# Patient Record
Sex: Female | Born: 1970 | Race: White | Hispanic: No | Marital: Married | Smoking: Former smoker
Health system: Southern US, Community
[De-identification: ages and names within clinical notes are randomized; demographics above are authoritative.]

## PROBLEM LIST (undated history)

## (undated) DIAGNOSIS — G35 Multiple sclerosis: Secondary | ICD-10-CM

## (undated) DIAGNOSIS — F419 Anxiety disorder, unspecified: Secondary | ICD-10-CM

## (undated) DIAGNOSIS — G709 Myoneural disorder, unspecified: Secondary | ICD-10-CM

## (undated) DIAGNOSIS — F4321 Adjustment disorder with depressed mood: Secondary | ICD-10-CM

## (undated) DIAGNOSIS — F988 Other specified behavioral and emotional disorders with onset usually occurring in childhood and adolescence: Secondary | ICD-10-CM

## (undated) DIAGNOSIS — G43909 Migraine, unspecified, not intractable, without status migrainosus: Secondary | ICD-10-CM

## (undated) HISTORY — PX: TONSILLECTOMY AND ADENOIDECTOMY: SHX28

## (undated) HISTORY — DX: Adjustment disorder with depressed mood: F43.21

## (undated) HISTORY — PX: WISDOM TOOTH EXTRACTION: SHX21

## (undated) HISTORY — DX: Other specified behavioral and emotional disorders with onset usually occurring in childhood and adolescence: F98.8

## (undated) HISTORY — DX: Myoneural disorder, unspecified: G70.9

## (undated) HISTORY — DX: Anxiety disorder, unspecified: F41.9

---

## 1996-11-16 HISTORY — PX: PILONIDAL CYST EXCISION: SHX744

## 2006-11-16 HISTORY — PX: DILATION AND CURETTAGE OF UTERUS: SHX78

## 2017-05-21 ENCOUNTER — Emergency Department (HOSPITAL_BASED_OUTPATIENT_CLINIC_OR_DEPARTMENT_OTHER)
Admission: EM | Admit: 2017-05-21 | Discharge: 2017-05-21 | Disposition: A | Discharge status: Home / Self Care | Payer: 59 | Attending: Emergency Medicine | Admitting: Emergency Medicine

## 2017-05-21 ENCOUNTER — Encounter (HOSPITAL_BASED_OUTPATIENT_CLINIC_OR_DEPARTMENT_OTHER): Payer: Self-pay | Admitting: *Deleted

## 2017-05-21 DIAGNOSIS — R1031 Right lower quadrant pain: Secondary | ICD-10-CM

## 2017-05-21 DIAGNOSIS — Z87891 Personal history of nicotine dependence: Secondary | ICD-10-CM | POA: Diagnosis not present

## 2017-05-21 DIAGNOSIS — N201 Calculus of ureter: Secondary | ICD-10-CM | POA: Diagnosis present

## 2017-05-21 DIAGNOSIS — K567 Ileus, unspecified: Secondary | ICD-10-CM

## 2017-05-21 DIAGNOSIS — N200 Calculus of kidney: Secondary | ICD-10-CM | POA: Insufficient documentation

## 2017-05-21 HISTORY — DX: Multiple sclerosis: G35

## 2017-05-21 HISTORY — DX: Migraine, unspecified, not intractable, without status migrainosus: G43.909

## 2017-05-21 LAB — COMPREHENSIVE METABOLIC PANEL
ALT: 13 U/L — AB (ref 14–54)
ANION GAP: 9 (ref 5–15)
AST: 17 U/L (ref 15–41)
Albumin: 3.9 g/dL (ref 3.5–5.0)
Alkaline Phosphatase: 62 U/L (ref 38–126)
BUN: 17 mg/dL (ref 6–20)
CHLORIDE: 103 mmol/L (ref 101–111)
CO2: 23 mmol/L (ref 22–32)
Calcium: 8.8 mg/dL — ABNORMAL LOW (ref 8.9–10.3)
Creatinine, Ser: 1 mg/dL (ref 0.44–1.00)
GFR calc non Af Amer: >60 mL/min (ref >60)
Glucose, Bld: 107 mg/dL — ABNORMAL HIGH (ref 65–99)
Potassium: 4.2 mmol/L (ref 3.5–5.1)
SODIUM: 135 mmol/L (ref 135–145)
Total Bilirubin: 0.4 mg/dL (ref 0.3–1.2)
Total Protein: 7.3 g/dL (ref 6.5–8.1)

## 2017-05-21 LAB — CBC WITH DIFFERENTIAL/PLATELET
Basophils Absolute: 0 10*3/uL (ref 0.0–0.1)
Basophils Relative: 0 %
Eosinophils Absolute: 0.1 10*3/uL (ref 0.0–0.7)
Eosinophils Relative: 1 %
HEMATOCRIT: 35.2 % — AB (ref 36.0–46.0)
HEMOGLOBIN: 11.9 g/dL — AB (ref 12.0–15.0)
LYMPHS ABS: 1.7 10*3/uL (ref 0.7–4.0)
Lymphocytes Relative: 10 %
MCH: 30.4 pg (ref 26.0–34.0)
MCHC: 33.8 g/dL (ref 30.0–36.0)
MCV: 89.8 fL (ref 78.0–100.0)
MONOS PCT: 7 %
Monocytes Absolute: 1.2 10*3/uL — ABNORMAL HIGH (ref 0.1–1.0)
NEUTROS ABS: 13.4 10*3/uL — AB (ref 1.7–7.7)
NEUTROS PCT: 82 %
Platelets: 252 10*3/uL (ref 150–400)
RBC: 3.92 MIL/uL (ref 3.87–5.11)
RDW: 12.7 % (ref 11.5–15.5)
WBC: 16.4 10*3/uL — ABNORMAL HIGH (ref 4.0–10.5)

## 2017-05-21 LAB — URINALYSIS, ROUTINE W REFLEX MICROSCOPIC
BILIRUBIN URINE: NEGATIVE
Glucose, UA: NEGATIVE mg/dL
HGB URINE DIPSTICK: NEGATIVE
Ketones, ur: 15 mg/dL — AB
Leukocytes, UA: NEGATIVE
NITRITE: NEGATIVE
PH: 6 (ref 5.0–8.0)
Protein, ur: NEGATIVE mg/dL

## 2017-05-21 MED ORDER — ONDANSETRON 4 MG PO TBDP: 4.0000 mg | ORAL_TABLET | Freq: Three times a day (TID) | ORAL | 0 refills | Status: DC | PRN

## 2017-05-21 MED ORDER — ONDANSETRON HCL 4 MG/2ML IJ SOLN
4.0000 mg | Freq: Once | INTRAMUSCULAR | Status: AC
  Administered 2017-05-21: 4 mg via INTRAVENOUS
  Filled 2017-05-21: qty 2

## 2017-05-21 MED ORDER — TAMSULOSIN HCL 0.4 MG PO CAPS: 0.4000 mg | ORAL_CAPSULE | Freq: Every day | ORAL | 0 refills | Status: DC

## 2017-05-21 MED ORDER — TAMSULOSIN HCL 0.4 MG PO CAPS
0.4000 mg | ORAL_CAPSULE | Freq: Every day | ORAL | Status: DC
  Administered 2017-05-21: 0.4 mg via ORAL
  Filled 2017-05-21: qty 1

## 2017-05-21 MED ORDER — TAMSULOSIN HCL 0.4 MG PO CAPS: 0.4000 mg | ORAL_CAPSULE | Freq: Every day | ORAL | 0 refills | Status: AC

## 2017-05-21 MED ORDER — IBUPROFEN 800 MG PO TABS: 800.0000 mg | ORAL_TABLET | Freq: Three times a day (TID) | ORAL | 0 refills | Status: DC | PRN

## 2017-05-21 MED ORDER — MORPHINE SULFATE (PF) 4 MG/ML IV SOLN
4.0000 mg | Freq: Once | INTRAVENOUS | Status: AC
  Administered 2017-05-21: 4 mg via INTRAVENOUS
  Filled 2017-05-21: qty 1

## 2017-05-21 MED ORDER — OXYCODONE-ACETAMINOPHEN 5-325 MG PO TABS
1.0000 | ORAL_TABLET | Freq: Once | ORAL | Status: AC
  Administered 2017-05-21: 1 via ORAL
  Filled 2017-05-21: qty 1

## 2017-05-21 MED ORDER — KETOROLAC TROMETHAMINE 30 MG/ML IJ SOLN
30.0000 mg | Freq: Once | INTRAMUSCULAR | Status: AC
  Administered 2017-05-21: 30 mg via INTRAVENOUS
  Filled 2017-05-21: qty 1

## 2017-05-21 MED ORDER — OXYCODONE-ACETAMINOPHEN 5-325 MG PO TABS: 1.0000 | ORAL_TABLET | Freq: Four times a day (QID) | ORAL | 0 refills | Status: DC | PRN

## 2017-05-21 NOTE — ED Triage Notes (Signed)
Reports being dx at [facility] today with L-sided kidney stone. States dx of obstruction and was instructed to come to ER.

## 2017-05-21 NOTE — Discharge Instructions (Signed)
You have been seen in the Emergency Department (ED) today for pain that we believe based on your workup, is caused by kidney stones.  As we have discussed, please drink plenty of fluids.  Please make a follow up appointment with the physician(s) listed elsewhere in this documentation. ° °You may take pain medication as needed but ONLY as prescribed.  Please also take your prescribed Flomax daily.  We also recommend that you take over-the-counter ibuprofen regularly according to label instructions over the next 5 days.  Take it with meals to minimize stomach discomfort. ° °Please see your doctor as soon as possible as stones may take 1-3 weeks to pass and you may require additional care or medications. ° °Do not drink alcohol, drive or participate in any other potentially dangerous activities while taking opiate pain medication as it may make you sleepy. Do not take this medication with any other sedating medications, either prescription or over-the-counter. If you were prescribed Percocet or Vicodin, do not take these with acetaminophen (Tylenol) as it is already contained within these medications. °  °Take Percocet as needed for severe pain.  This medication is an opiate (or narcotic) pain medication and can be habit forming.  Use it as little as possible to achieve adequate pain control.  Do not use or use it with extreme caution if you have a history of opiate abuse or dependence.  If you are on a pain contract with your primary care doctor or a pain specialist, be sure to let them know you were prescribed this medication today from the Emergency Department.  This medication is intended for your use only - do not give any to anyone else and keep it in a secure place where nobody else, especially children, have access to it.  It will also cause or worsen constipation, so you may want to consider taking an over-the-counter stool softener while you are taking this medication. ° °Return to the Emergency Department  (ED) or call your doctor if you have any worsening pain, fever, painful urination, are unable to urinate, or develop other symptoms that concern you. ° ° ° °Kidney Stones °Kidney stones (urolithiasis) are deposits that form inside your kidneys. The intense pain is caused by the stone moving through the urinary tract. When the stone moves, the ureter goes into spasm around the stone. The stone is usually passed in the urine.  °CAUSES  °A disorder that makes certain neck glands produce too much parathyroid hormone (primary hyperparathyroidism). °A buildup of uric acid crystals, similar to gout in your joints. °Narrowing (stricture) of the ureter. °A kidney obstruction present at birth (congenital obstruction). °Previous surgery on the kidney or ureters. °Numerous kidney infections. °SYMPTOMS  °Feeling sick to your stomach (nauseous). °Throwing up (vomiting). °Blood in the urine (hematuria). °Pain that usually spreads (radiates) to the groin. °Frequency or urgency of urination. °DIAGNOSIS  °Taking a history and physical exam. °Blood or urine tests. °CT scan. °Occasionally, an examination of the inside of the urinary bladder (cystoscopy) is performed. °TREATMENT  °Observation. °Increasing your fluid intake. °Extracorporeal shock wave lithotripsy--This is a noninvasive procedure that uses shock waves to break up kidney stones. °Surgery may be needed if you have severe pain or persistent obstruction. There are various surgical procedures. Most of the procedures are performed with the use of small instruments. Only small incisions are needed to accommodate these instruments, so recovery time is minimized. °The size, location, and chemical composition are all important variables that will determine the proper   choice of action for you. Talk to your health care provider to better understand your situation so that you will minimize the risk of injury to yourself and your kidney.  °HOME CARE INSTRUCTIONS  °Drink enough water  and fluids to keep your urine clear or pale yellow. This will help you to pass the stone or stone fragments. °Strain all urine through the provided strainer. Keep all particulate matter and stones for your health care provider to see. The stone causing the pain may be as small as a grain of salt. It is very important to use the strainer each and every time you pass your urine. The collection of your stone will allow your health care provider to analyze it and verify that a stone has actually passed. The stone analysis will often identify what you can do to reduce the incidence of recurrences. °Only take over-the-counter or prescription medicines for pain, discomfort, or fever as directed by your health care provider. °Keep all follow-up visits as told by your health care provider. This is important. °Get follow-up X-rays if required. The absence of pain does not always mean that the stone has passed. It may have only stopped moving. If the urine remains completely obstructed, it can cause loss of kidney function or even complete destruction of the kidney. It is your responsibility to make sure X-rays and follow-ups are completed. Ultrasounds of the kidney can show blockages and the status of the kidney. Ultrasounds are not associated with any radiation and can be performed easily in a matter of minutes. °Make changes to your daily diet as told by your health care provider. You may be told to: °Limit the amount of salt that you eat. °Eat 5 or more servings of fruits and vegetables each day. °Limit the amount of meat, poultry, fish, and eggs that you eat. °Collect a 24-hour urine sample as told by your health care provider. You may need to collect another urine sample every 6-12 months. °SEEK MEDICAL CARE IF: °You experience pain that is progressive and unresponsive to any pain medicine you have been prescribed. °SEEK IMMEDIATE MEDICAL CARE IF:  °Pain cannot be controlled with the prescribed medicine. °You have a  fever or shaking chills. °The severity or intensity of pain increases over 18 hours and is not relieved by pain medicine. °You develop a new onset of abdominal pain. °You feel faint or pass out. °You are unable to urinate. °  °This information is not intended to replace advice given to you by your health care provider. Make sure you discuss any questions you have with your health care provider. °  °Document Released: 11/02/2005 Document Revised: 07/24/2015 Document Reviewed: 04/05/2013 °Elsevier Interactive Patient Education ©2016 [employer] ° ° °

## 2017-05-21 NOTE — ED Provider Notes (Signed)
Emergency Department Provider Note   I have reviewed the triage vital signs and the nursing notes.   HISTORY  Chief Complaint Nephrolithiasis   HPI Tonya Curry is a 46 y.o. female with PMH of MS and Migraine HA presents to the emergency department for evaluation of obstructing right ureteral stone found on CT scan today. The patient reports right flank and abdominal pain has been constant over the past several days. She denies any hematuria, dysuria, fever. She has no prior history of kidney stone. She has had severe pain despite taking Motrin at home. She's been finding it very difficult to sleep at night because of pain. She's also had some associated constipation. She went to an urgent care today for evaluation and was sent for CT scan at an outpatient imaging center. She was called and told that she has an obstructing kidney stone on the right side and she should present to the emergency department immediately.   Since leaving the imaging department she continues to have severe right-sided pain and associated nausea. No vomiting. Pain is radiating slightly to her right inguinal crease. No vaginal bleeding or discharge.   Past Medical History:  Diagnosis Date  . Migraines   . Multiple sclerosis ([place])     There are no active problems to display for this patient.   Past Surgical History:  Procedure Laterality Date  . DILATION AND CURETTAGE OF UTERUS  2008    Current Outpatient Rx  . Order #: [id number] Class: Historical Med  . Order #: [id number] Class: Historical Med  . Order #: [id number] Class: Historical Med  . Order #: [id number] Class: Print  . Order #: [id number] Class: Print  . Order #: [id number] Class: Print  . Order #: [id number] Class: Print    Allergies Patient has no allergy information on record.  No family history on file.  Social History Social History  Substance Use Topics  . Smoking status: Former Research scientist (life sciences)  . Smokeless tobacco: Never Used  . Alcohol use  Yes     Comment: occ    Review of Systems  Constitutional: No fever/chills Eyes: No visual changes. ENT: No sore throat. Cardiovascular: Denies chest pain. Respiratory: Denies shortness of breath. Gastrointestinal: Positive right sided abdominal pain. Positive nausea, no vomiting.  No diarrhea.  No constipation. Genitourinary: Negative for dysuria. Positive right flank pain.  Musculoskeletal: Negative for back pain. Skin: Negative for rash. Neurological: Negative for headaches, focal weakness or numbness.  10-point ROS otherwise negative.  ____________________________________________   PHYSICAL EXAM:  VITAL SIGNS: ED Triage Vitals  Enc Vitals Group     BP 05/21/17 1651 111/88     Pulse Rate 05/21/17 1651 69     Resp 05/21/17 1651 16     Temp 05/21/17 1651 99.7 F (37.6 C)     Temp Source 05/21/17 1651 Oral     SpO2 05/21/17 1651 99 %     Weight 05/21/17 1652 135 lb (61.2 kg)     Height 05/21/17 1652 5\' 3"  (1.6 m)     Pain Score 05/21/17 1650 10   Constitutional: Alert and oriented. Appears uncomfortable.  Eyes: Conjunctivae are normal. Head: Atraumatic. Nose: No congestion/rhinnorhea. Mouth/Throat: Mucous membranes are moist.  Oropharynx non-erythematous. Neck: No stridor.  Cardiovascular: Normal rate, regular rhythm. Good peripheral circulation. Grossly normal heart sounds.   Respiratory: Normal respiratory effort.  No retractions. Lungs CTAB. Gastrointestinal: Soft with focal RLQ tenderness to palpation. No rebound but some voluntary guarding noted. Mild distention.  Musculoskeletal: No lower extremity tenderness nor edema. No  gross deformities of extremities. Neurologic:  Normal speech and language. No gross focal neurologic deficits are appreciated.  Skin:  Skin is warm, dry and intact. No rash noted.  ____________________________________________   LABS (all labs ordered are listed, but only abnormal results are displayed)  Labs Reviewed  COMPREHENSIVE  METABOLIC PANEL - Abnormal; Notable for the following:       Result Value   Glucose, Bld 107 (*)    Calcium 8.8 (*)    ALT 13 (*)    All other components within normal limits  CBC WITH DIFFERENTIAL/PLATELET - Abnormal; Notable for the following:    WBC 16.4 (*)    Hemoglobin 11.9 (*)    HCT 35.2 (*)    Neutro Abs 13.4 (*)    Monocytes Absolute 1.2 (*)    All other components within normal limits  URINALYSIS, ROUTINE W REFLEX MICROSCOPIC - Abnormal; Notable for the following:    Specific Gravity, Urine >1.046 (*)    Ketones, ur 15 (*)    All other components within normal limits  URINE CULTURE   ____________________________________________  RADIOLOGY  CT abdomen/pelvis with contrast from [facility] [place] facility 05/21/17:  IMPRESSION: 1.10 x 5 mm calculus at the right ureterovesical junction producing mild to moderate hydronephrosis. 2.Numerous small suspected cysts scattered throughout the liver. 3.Air-fluid levels throughout the colon most suggestive of mild ileus/gastroenteritis.    Electronically Signed by: Lilia Pro ____________________________________________   PROCEDURES  Procedure(s) performed:   Procedures  None ____________________________________________   INITIAL IMPRESSION / ASSESSMENT AND PLAN / ED COURSE  Pertinent labs & imaging results that were available during my care of the patient were reviewed by me and considered in my medical decision making (see chart for details).  Patient resents to the emergency department for evaluation of severe right sided flank pain with CT scan performed at outside imaging center showing a 10x5 mm right ureteral stone and the UVJ with associated mild/moderate hydronephrosis. Reviewed on Care Everywhere through [place] facility. Patient also with air-fluid levels throughout the colon suggestive of ileus versus gastroenteritis. No concern on outside read of SBO. Patient has focal right sided abdominal tenderness to palpation.  I am administering Toradol and morphine along with Zofran for pain control. Outside labs were point of care and did not include a chemistry. Plan to reassess after labs, UA, urine culture, and pain medication.   07:23 PM Patient is much more comfortable after Toradol and morphine. She is ambulatory to the bathroom to give a urine sample. Labs and UA are largely unremarkable with the exception of leukocytosis which I expect in this clinical setting. Given the size of stone I called the Urologist on call Dr. Ruby Cola who advised the patient call the office on Monday for urgent appointment.   At this time, I do not feel there is any life-threatening condition present. I have reviewed and discussed all results (EKG, imaging, lab, urine as appropriate), exam findings with patient. I have reviewed nursing notes and appropriate previous records.  I feel the patient is safe to be discharged home without further emergent workup. Discussed usual and customary return precautions. Patient and family (if present) verbalize understanding and are comfortable with this plan.  Patient will follow-up with their primary care provider. If they do not have a primary care provider, information for follow-up has been provided to them. All questions have been answered.  ____________________________________________  FINAL CLINICAL IMPRESSION(S) / ED DIAGNOSES  Final diagnoses:  Kidney stone  Ileus ([place])  Right lower quadrant abdominal  pain     MEDICATIONS GIVEN DURING THIS VISIT:  Medications  ketorolac (TORADOL) 30 MG/ML injection 30 mg (30 mg Intravenous Given 05/21/17 1745)  morphine 4 MG/ML injection 4 mg (4 mg Intravenous Given 05/21/17 1746)  ondansetron (ZOFRAN) injection 4 mg (4 mg Intravenous Given 05/21/17 1745)  oxyCODONE-acetaminophen (PERCOCET/ROXICET) 5-325 MG per tablet 1 tablet (1 tablet Oral Given 05/21/17 1944)     NEW OUTPATIENT MEDICATIONS STARTED DURING THIS VISIT:  None   Note:  This document  was prepared using Dragon voice recognition software and may include unintentional dictation errors.  Nanda Quinton, MD Emergency Medicine    Adri Schloss, [facility], MD 05/22/17 [phone]

## 2017-05-23 LAB — URINE CULTURE: Special Requests: NORMAL

## 2017-12-14 ENCOUNTER — Telehealth: Payer: Self-pay | Admitting: *Deleted

## 2017-12-14 NOTE — Telephone Encounter (Signed)
LMVM for pt to return call to r/s appt due to weather and delay in opening.

## 2017-12-14 NOTE — Telephone Encounter (Signed)
I LMVM for pt that I made mistake and she is to keep appt 12-16-17.

## 2017-12-16 ENCOUNTER — Ambulatory Visit (INDEPENDENT_AMBULATORY_CARE_PROVIDER_SITE_OTHER): Payer: 59 | Admitting: Neurology

## 2017-12-16 ENCOUNTER — Ambulatory Visit: Payer: 59 | Admitting: Neurology

## 2017-12-16 ENCOUNTER — Encounter: Payer: Self-pay | Admitting: Neurology

## 2017-12-16 VITALS — BP 117/80 | HR 84 | Ht 62.5 in | Wt 136.0 lb

## 2017-12-16 DIAGNOSIS — G43709 Chronic migraine without aura, not intractable, without status migrainosus: Secondary | ICD-10-CM | POA: Diagnosis not present

## 2017-12-16 DIAGNOSIS — G35 Multiple sclerosis: Secondary | ICD-10-CM | POA: Diagnosis not present

## 2017-12-16 DIAGNOSIS — IMO0002 Reserved for concepts with insufficient information to code with codable children: Secondary | ICD-10-CM | POA: Insufficient documentation

## 2017-12-16 NOTE — Progress Notes (Signed)
PATIENT: Tonya Curry DOB: 04/08/1971  Chief Complaint  Patient presents with  . New Patient (Initial Visit)    PCP: Dr. Jimmie Molly, Ref: Dr. Domenick Gong  . Multiple Sclerosis    Dx in 12/2000. Patient was on Avonex in the past but it made her too sick. Last MRI about 10 yrs ago.      HISTORICAL side Tonya Curry is a 47 year old female, seen in refer by her primary care doctor Tonya Curry, for evaluation of multiple sclerosis, initial evaluation was on December 16, 2017.  I reviewed and summarized the referring note, she recently moved from [place] to [place], has history of Relapsing Remitting Multiple Sclerosis, chronic migraine headaches, last visit with her neurologist was in 2005.  She presented with acute onset of radiating pain to bilateral upper and lower extremity in February 2002, 4 months postpartum, symptoms quickly progressed to difficulty walking, with profound right arm, and right leg weakness, the diagnosis of relapsing remitting multiple sclerosis was based on abnormal MRI of cervical spine, there was no MRI brain lesions per patient's recall.  She was treated with steroid for 1 week, with significant side effect, fortunately, her motor deficit improved quickly.  She also had spinal tap, evoked potential study, extensive laboratory evaluations, which all support a diagnosis of multiple sclerosis,  She was treated with Avonex from 2002 to October 2005, there was no recurrent symptoms, she stopped the treatment, had a second pregnancy, did very well,  Over the years, she is highly function, work as a Environmental manager, continue have right leg burning sensation from knee down, taking Cymbalta every morning, but denies gait abnormality, no recurrent neurological symptoms, has bladder urgency, denies significant gait abnormality..  She also has history of chronic migraine headache for many years, taking Topamax 50 mg every morning as migraine prevention,  Zomig works very well.  Trigger for her migraine are weather change, stress, hungry, dehydration.  Over the years, complains of gradual worsening fatigue, also complains of difficulty focusing, was started on Adderall treatment since December 2018.   REVIEW OF SYSTEMS: Full 14 system review of systems performed and notable only for as above.  ALLERGIES: Not on File  HOME MEDICATIONS: Current Outpatient Medications  Medication Sig Dispense Refill  . amphetamine-dextroamphetamine (ADDERALL) 20 MG tablet Take 20 mg by mouth daily.    . DULoxetine (CYMBALTA) 60 MG capsule Take 60 mg by mouth daily.    Marland Kitchen topiramate (TOPAMAX) 50 MG tablet Take 50 mg by mouth 2 (two) times daily.    Marland Kitchen zolmitriptan (ZOMIG) 5 MG tablet Take 5 mg by mouth as needed for migraine.     No current facility-administered medications for this visit.     PAST MEDICAL HISTORY: Past Medical History:  Diagnosis Date  . Migraines   . Multiple sclerosis ([place])     PAST SURGICAL HISTORY: Past Surgical History:  Procedure Laterality Date  . DILATION AND CURETTAGE OF UTERUS  2008    FAMILY HISTORY: No family history on file.  SOCIAL HISTORY:  Social History   Socioeconomic History  . Marital status: Married    Spouse name: Not on file  . Number of children: Not on file  . Years of education: Not on file  . Highest education level: Not on file  Social Needs  . Financial resource strain: Not on file  . Food insecurity - worry: Not on file  . Food insecurity - inability: Not on file  . Transportation needs - medical:  Not on file  . Transportation needs - non-medical: Not on file  Occupational History  . Not on file  Tobacco Use  . Smoking status: Former Research scientist (life sciences)  . Smokeless tobacco: Never Used  Substance and Sexual Activity  . Alcohol use: Yes    Comment: occ  . Drug use: No  . Sexual activity: Not on file  Other Topics Concern  . Not on file  Social History Narrative  . Not on file     PHYSICAL  EXAM   Vitals:   12/16/17 0752  BP: 117/80  Pulse: 84  Weight: 136 lb (61.7 kg)  Height: 5' 2.5" (1.588 m)    Not recorded      Body mass index is 24.48 kg/m.  PHYSICAL EXAMNIATION:  Gen: NAD, conversant, well nourised, obese, well groomed                     Cardiovascular: Regular rate rhythm, no peripheral edema, warm, nontender. Eyes: Conjunctivae clear without exudates or hemorrhage Neck: Supple, no carotid bruits. Pulmonary: Clear to auscultation bilaterally   NEUROLOGICAL EXAM:  MENTAL STATUS: Speech:    Speech is normal; fluent and spontaneous with normal comprehension.  Cognition:     Orientation to time, place and person     Normal recent and remote memory     Normal Attention span and concentration     Normal Language, naming, repeating,spontaneous speech     Fund of knowledge   CRANIAL NERVES: CN II: Visual fields are full to confrontation. Fundoscopic exam is normal with sharp discs and no vascular changes. Pupils are round equal and briskly reactive to light. CN III, IV, VI: extraocular movement are normal. No ptosis. CN V: Facial sensation is intact to pinprick in all 3 divisions bilaterally. Corneal responses are intact.  CN VII: Face is symmetric with normal eye closure and smile. CN VIII: Hearing is normal to rubbing fingers CN IX, X: Palate elevates symmetrically. Phonation is normal. CN XI: Head turning and shoulder shrug are intact CN XII: Tongue is midline with normal movements and no atrophy.  MOTOR: There is no pronator drift of out-stretched arms. Muscle bulk and tone are normal. Muscle strength is normal.  REFLEXES: Reflexes are 3 and symmetric at the biceps, triceps, knees, and ankles. Plantar responses are flexor.  SENSORY: Intact to light touch, pinprick, positional sensation and vibratory sensation are intact in fingers and toes.  COORDINATION: Rapid alternating movements and fine finger movements are intact. There is no dysmetria  on finger-to-nose and heel-knee-shin.    GAIT/STANCE: Posture is normal. Gait is steady with normal steps, base, arm swing, and turning. Heel and toe walking are normal. Tandem gait is normal.  Romberg is absent.   DIAGNOSTIC DATA (LABS, IMAGING, TESTING) - I reviewed patient records, labs, notes, testing and imaging myself where available.   ASSESSMENT AND PLAN  Tonya Curry is a 47 y.o. female    Relapsing remitting multiple sclerosis  She was diagnosed in 2002  Was treated with Avonex from 2002 to  October 2005  MRI of brain, cervical spine with without contrast  Laboratory evaluations  Return to clinic in 1 month   Chronic migraine headaches  Keep Topamax 50 mg every day  Zomig as needed  Marcial Pacas, M.D. Ph.D.  [facility] [address], [place], [place] [address] Ph: [phone] Fax: [phone]  CC: Tonya Battles, MD

## 2017-12-18 LAB — COMPREHENSIVE METABOLIC PANEL
ALBUMIN: 4.7 g/dL (ref 3.5–5.5)
ALT: 13 IU/L (ref 0–32)
AST: 15 IU/L (ref 0–40)
Albumin/Globulin Ratio: 1.8 (ref 1.2–2.2)
Alkaline Phosphatase: 77 IU/L (ref 39–117)
BUN / CREAT RATIO: 16 (ref 9–23)
BUN: 11 mg/dL (ref 6–24)
Bilirubin Total: 0.3 mg/dL (ref 0.0–1.2)
CALCIUM: 9.8 mg/dL (ref 8.7–10.2)
CO2: 22 mmol/L (ref 20–29)
CREATININE: 0.68 mg/dL (ref 0.57–1.00)
Chloride: 104 mmol/L (ref 96–106)
GFR, EST AFRICAN AMERICAN: 122 mL/min/{1.73_m2} (ref >59)
GFR, EST NON AFRICAN AMERICAN: 106 mL/min/{1.73_m2} (ref >59)
GLUCOSE: 85 mg/dL (ref 65–99)
Globulin, Total: 2.6 g/dL (ref 1.5–4.5)
Potassium: 4.7 mmol/L (ref 3.5–5.2)
Sodium: 141 mmol/L (ref 134–144)
TOTAL PROTEIN: 7.3 g/dL (ref 6.0–8.5)

## 2017-12-18 LAB — CBC WITH DIFFERENTIAL/PLATELET
BASOS ABS: 0 10*3/uL (ref 0.0–0.2)
Basos: 0 %
EOS (ABSOLUTE): 0.1 10*3/uL (ref 0.0–0.4)
EOS: 1 %
HEMATOCRIT: 40.8 % (ref 34.0–46.6)
HEMOGLOBIN: 13.9 g/dL (ref 11.1–15.9)
IMMATURE GRANS (ABS): 0 10*3/uL (ref 0.0–0.1)
Immature Granulocytes: 0 %
LYMPHS: 32 %
Lymphocytes Absolute: 2.4 10*3/uL (ref 0.7–3.1)
MCH: 30.4 pg (ref 26.6–33.0)
MCHC: 34.1 g/dL (ref 31.5–35.7)
MCV: 89 fL (ref 79–97)
MONOCYTES: 5 %
Monocytes Absolute: 0.4 10*3/uL (ref 0.1–0.9)
Neutrophils Absolute: 4.5 10*3/uL (ref 1.4–7.0)
Neutrophils: 62 %
Platelets: 356 10*3/uL (ref 150–379)
RBC: 4.57 x10E6/uL (ref 3.77–5.28)
RDW: 13.4 % (ref 12.3–15.4)
WBC: 7.3 10*3/uL (ref 3.4–10.8)

## 2017-12-18 LAB — COPPER, SERUM: COPPER: 132 ug/dL (ref 72–166)

## 2017-12-18 LAB — FERRITIN: FERRITIN: 60 ng/mL (ref 15–150)

## 2017-12-18 LAB — VITAMIN B12: Vitamin B-12: 1205 pg/mL (ref 232–1245)

## 2017-12-18 LAB — VITAMIN D 25 HYDROXY (VIT D DEFICIENCY, FRACTURES): Vit D, 25-Hydroxy: 29.3 ng/mL — ABNORMAL LOW (ref 30.0–100.0)

## 2017-12-18 LAB — SEDIMENTATION RATE: SED RATE: 2 mm/h (ref 0–32)

## 2017-12-18 LAB — RPR: RPR Ser Ql: NONREACTIVE

## 2017-12-18 LAB — ANA W/REFLEX: ANA: NEGATIVE

## 2017-12-18 LAB — TSH: TSH: 2.15 u[IU]/mL (ref 0.450–4.500)

## 2017-12-18 LAB — CK: CK TOTAL: 81 U/L (ref 24–173)

## 2017-12-18 LAB — C-REACTIVE PROTEIN: CRP: 3.2 mg/L (ref 0.0–4.9)

## 2017-12-18 LAB — HGB A1C W/O EAG: Hgb A1c MFr Bld: 5.4 % (ref 4.8–5.6)

## 2017-12-22 ENCOUNTER — Telehealth: Payer: Self-pay | Admitting: *Deleted

## 2017-12-22 NOTE — Telephone Encounter (Signed)
Spoke with patient and informed her, per Dr Krista Blue her labs are normal. Patient verbalized understanding, appreciation.

## 2018-02-03 ENCOUNTER — Institutional Professional Consult (permissible substitution): Payer: 59 | Admitting: Neurology

## 2018-02-04 ENCOUNTER — Other Ambulatory Visit: Payer: 59

## 2018-02-07 ENCOUNTER — Ambulatory Visit: Payer: 59 | Admitting: Neurology

## 2018-02-08 ENCOUNTER — Encounter: Payer: Self-pay | Admitting: Neurology

## 2018-02-11 ENCOUNTER — Ambulatory Visit
Admission: RE | Admit: 2018-02-11 | Discharge: 2018-02-11 | Disposition: A | Discharge status: Home / Self Care | Payer: 59 | Source: Ambulatory Visit | Attending: Neurology | Admitting: Neurology

## 2018-02-11 DIAGNOSIS — IMO0002 Reserved for concepts with insufficient information to code with codable children: Secondary | ICD-10-CM

## 2018-02-11 DIAGNOSIS — G43709 Chronic migraine without aura, not intractable, without status migrainosus: Secondary | ICD-10-CM

## 2018-02-11 DIAGNOSIS — G35 Multiple sclerosis: Secondary | ICD-10-CM

## 2018-02-11 MED ORDER — GADOBENATE DIMEGLUMINE 529 MG/ML IV SOLN: 12.0000 mL | Freq: Once | INTRAVENOUS | Status: DC | PRN

## 2018-02-11 MED ORDER — GADOBENATE DIMEGLUMINE 529 MG/ML IV SOLN
12.0000 mL | Freq: Once | INTRAVENOUS | Status: AC | PRN
  Administered 2018-02-11: 12 mL via INTRAVENOUS

## 2018-02-14 ENCOUNTER — Telehealth: Payer: Self-pay | Admitting: *Deleted

## 2018-02-14 ENCOUNTER — Telehealth: Payer: Self-pay | Admitting: Neurology

## 2018-02-14 NOTE — Telephone Encounter (Signed)
-----   Message from Marcial Pacas, MD sent at 02/14/2018  3:21 PM EDT ----- Please call pt for normal MRI brain.

## 2018-02-14 NOTE — Telephone Encounter (Signed)
Please call patient, MRI of the brain is normal, cervical spine showed mild signal adjacent to C4, there is no acute process,  Will review MRI at her next follow-up visit   IMPRESSION: This MRI of the cervical spine with and without contrast shows the following: 1.    Adjacent to C4 within the spinal cord there is the suggestion of a small focus on the sagittal T2 and STIR images that is not confirmed on the axial images.  This could be artifactual or represent a small focus of demyelination or myelomalacia. 2.    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  There is no definite nerve root compression though there is some encroachment upon the left C6 nerve root. 3.    There is a normal enhancement pattern.  MRI brain is normal.

## 2018-02-15 NOTE — Telephone Encounter (Signed)
Left message requesting a return call.

## 2018-02-15 NOTE — Telephone Encounter (Signed)
Spoke to patient - she is aware of results and her follow up has been moved to an earlier date for further discussion with Dr. Krista Blue.

## 2018-02-24 ENCOUNTER — Ambulatory Visit (INDEPENDENT_AMBULATORY_CARE_PROVIDER_SITE_OTHER): Payer: 59 | Admitting: Neurology

## 2018-02-24 ENCOUNTER — Encounter (INDEPENDENT_AMBULATORY_CARE_PROVIDER_SITE_OTHER): Payer: Self-pay

## 2018-02-24 ENCOUNTER — Encounter: Payer: Self-pay | Admitting: Neurology

## 2018-02-24 VITALS — BP 104/71 | HR 75 | Ht 62.5 in | Wt 135.5 lb

## 2018-02-24 DIAGNOSIS — G35 Multiple sclerosis: Secondary | ICD-10-CM | POA: Diagnosis not present

## 2018-02-24 DIAGNOSIS — G43709 Chronic migraine without aura, not intractable, without status migrainosus: Secondary | ICD-10-CM | POA: Diagnosis not present

## 2018-02-24 DIAGNOSIS — IMO0002 Reserved for concepts with insufficient information to code with codable children: Secondary | ICD-10-CM

## 2018-02-24 MED ORDER — NORTRIPTYLINE HCL 10 MG PO CAPS: 30.0000 mg | ORAL_CAPSULE | Freq: Every day | ORAL | 11 refills | Status: DC

## 2018-02-24 NOTE — Progress Notes (Signed)
PATIENT: Tonya Curry DOB: September 25, 1971  Chief Complaint  Patient presents with  . Multiple Sclerosis    She would like to review her MRI results.  . Migraine    Topamax and Zomig work well for her migraines.     HISTORICAL side Tonya Curry is a 47 year old female, seen in refer by her primary care doctor Leanna Battles, for evaluation of multiple sclerosis, initial evaluation was on December 16, 2017.  I reviewed and summarized the referring note, she recently moved from [place] to [place], has history of Relapsing Remitting Multiple Sclerosis, chronic migraine headaches, last visit with her neurologist was in 2005.  She presented with acute onset of radiating pain to bilateral upper and lower extremity in February 2002, 4 months postpartum, symptoms quickly progressed to difficulty walking, with profound right arm, and right leg weakness, the diagnosis of relapsing remitting multiple sclerosis was based on abnormal MRI of cervical spine, there was no MRI brain lesions per patient's recall.  She was treated with steroid for 1 week, with significant side effect, fortunately, her motor deficit improved quickly.  She also had spinal tap, evoked potential study, extensive laboratory evaluations, which all support a diagnosis of multiple sclerosis,  She was treated with Avonex from 2002 to October 2005, there was no recurrent symptoms, she stopped the treatment, had a second pregnancy, did very well,  Over the years, she is highly function, work as a Environmental manager, continue have right leg burning sensation from knee down, taking Cymbalta every morning, but denies gait abnormality, no recurrent neurological symptoms, has bladder urgency, denies significant gait abnormality..  She also has history of chronic migraine headache for many years, taking Topamax 50 mg every morning as migraine prevention, Zomig works very well.  Trigger for her migraine are weather change, stress,  hungry, dehydration.  Over the years, complains of gradual worsening fatigue, also complains of difficulty focusing, was started on Adderall treatment since December 2018.  UPDATE February 24 2018: She is overall doing very well there is no new neurological symptoms, but she continue complains of slow worsening fatigue, intermittent right lower extremity paresthesia, responding to Cymbalta 60 mg, also chronic insomnia,  She is taking Adderall 25 mg daily, initially it did help her fatigue and the concentration, now seems to be less effective, Her migraine headache overall is under good control with Topamax 50 mg daily  We have personally reviewed MRI of the brain with and without contrast in March 2019 that was normal  MRI of cervical spine showed hazy area of signal abnormality at C4, no contrast enhancement,   REVIEW OF SYSTEMS: Full 14 system review of systems performed and notable only for as above.  ALLERGIES: Not on File  HOME MEDICATIONS: Current Outpatient Medications  Medication Sig Dispense Refill  . amphetamine-dextroamphetamine (ADDERALL XR) 25 MG 24 hr capsule Take 25 mg by mouth daily.    . DULoxetine (CYMBALTA) 60 MG capsule Take 60 mg by mouth daily.    Marland Kitchen topiramate (TOPAMAX) 50 MG tablet Take 50 mg by mouth at bedtime.     Marland Kitchen zolmitriptan (ZOMIG) 5 MG tablet Take 5 mg by mouth as needed for migraine.     No current facility-administered medications for this visit.     PAST MEDICAL HISTORY: Past Medical History:  Diagnosis Date  . Migraines   . Multiple sclerosis ([place])     PAST SURGICAL HISTORY: Past Surgical History:  Procedure Laterality Date  . DILATION AND CURETTAGE OF UTERUS  2008    FAMILY HISTORY: History reviewed. No pertinent family history.  SOCIAL HISTORY:  Social History   Socioeconomic History  . Marital status: Married    Spouse name: Not on file  . Number of children: Not on file  . Years of education: Not on file  . Highest education  level: Not on file  Occupational History  . Not on file  Social Needs  . Financial resource strain: Not on file  . Food insecurity:    Worry: Not on file    Inability: Not on file  . Transportation needs:    Medical: Not on file    Non-medical: Not on file  Tobacco Use  . Smoking status: Former Research scientist (life sciences)  . Smokeless tobacco: Never Used  Substance and Sexual Activity  . Alcohol use: Yes    Comment: occ  . Drug use: No  . Sexual activity: Not on file  Lifestyle  . Physical activity:    Days per week: Not on file    Minutes per session: Not on file  . Stress: Not on file  Relationships  . Social connections:    Talks on phone: Not on file    Gets together: Not on file    Attends religious service: Not on file    Active member of club or organization: Not on file    Attends meetings of clubs or organizations: Not on file    Relationship status: Not on file  . Intimate partner violence:    Fear of current or ex partner: Not on file    Emotionally abused: Not on file    Physically abused: Not on file    Forced sexual activity: Not on file  Other Topics Concern  . Not on file  Social History Narrative  . Not on file     PHYSICAL EXAM   Vitals:   02/24/18 0731  BP: 104/71  Pulse: 75  Weight: 135 lb 8 oz (61.5 kg)  Height: 5' 2.5" (1.588 m)    Not recorded      Body mass index is 24.39 kg/m.  PHYSICAL EXAMNIATION:  Gen: NAD, conversant, well nourised, obese, well groomed                     Cardiovascular: Regular rate rhythm, no peripheral edema, warm, nontender. Eyes: Conjunctivae clear without exudates or hemorrhage Neck: Supple, no carotid bruits. Pulmonary: Clear to auscultation bilaterally   NEUROLOGICAL EXAM:  MENTAL STATUS: Speech:    Speech is normal; fluent and spontaneous with normal comprehension.  Cognition:     Orientation to time, place and person     Normal recent and remote memory     Normal Attention span and concentration     Normal  Language, naming, repeating,spontaneous speech     Fund of knowledge   CRANIAL NERVES: CN II: Visual fields are full to confrontation. Fundoscopic exam is normal with sharp discs and no vascular changes. Pupils are round equal and briskly reactive to light. CN III, IV, VI: extraocular movement are normal. No ptosis. CN V: Facial sensation is intact to pinprick in all 3 divisions bilaterally. Corneal responses are intact.  CN VII: Face is symmetric with normal eye closure and smile. CN VIII: Hearing is normal to rubbing fingers CN IX, X: Palate elevates symmetrically. Phonation is normal. CN XI: Head turning and shoulder shrug are intact CN XII: Tongue is midline with normal movements and no atrophy.  MOTOR: There is no pronator drift  of out-stretched arms. Muscle bulk and tone are normal. Muscle strength is normal.  REFLEXES: Reflexes are 3 and symmetric at the biceps, triceps, knees, and ankles. Plantar responses are flexor.  None sustained bilateral ankle clonus  SENSORY: Intact to light touch, pinprick, positional sensation and vibratory sensation are intact in fingers and toes.  COORDINATION: Rapid alternating movements and fine finger movements are intact. There is no dysmetria on finger-to-nose and heel-knee-shin.    GAIT/STANCE: Posture is normal. Gait is steady with normal steps, base, arm swing, and turning. Heel and toe walking are normal. Tandem gait is normal.  Romberg is absent.   DIAGNOSTIC DATA (LABS, IMAGING, TESTING) - I reviewed patient records, labs, notes, testing and imaging myself where available.   ASSESSMENT AND PLAN  Damaria Vachon is a 47 y.o. female    Relapsing remitting multiple sclerosis  She was diagnosed in 2002  Was treated with Avonex from 2002 to  October 2005  MRI of brain was normal, cervical spine with without contrast showed hazy C4 myelomalacia  Overall stable, not on long-term immunomodulation therapy Chronic migraine headaches,  chronic insomnia  May try nortriptyline 10 mg, gradually titrating up to 50 mg every night  Zomig as needed  Marcial Pacas, M.D. Ph.D.  [facility] [address], [place], [place] [address] Ph: [phone] Fax: [phone]  CC: Leanna Battles, MD

## 2018-03-23 ENCOUNTER — Encounter

## 2018-03-23 ENCOUNTER — Ambulatory Visit: Payer: 59 | Admitting: Neurology

## 2018-03-25 ENCOUNTER — Other Ambulatory Visit: Payer: Self-pay | Admitting: Neurology

## 2018-08-29 ENCOUNTER — Ambulatory Visit (INDEPENDENT_AMBULATORY_CARE_PROVIDER_SITE_OTHER): Payer: 59 | Admitting: Psychology

## 2018-08-29 DIAGNOSIS — F331 Major depressive disorder, recurrent, moderate: Secondary | ICD-10-CM

## 2018-09-20 DIAGNOSIS — F331 Major depressive disorder, recurrent, moderate: Secondary | ICD-10-CM

## 2018-09-22 ENCOUNTER — Ambulatory Visit (INDEPENDENT_AMBULATORY_CARE_PROVIDER_SITE_OTHER): Payer: 59 | Admitting: Psychology

## 2018-09-22 DIAGNOSIS — F331 Major depressive disorder, recurrent, moderate: Secondary | ICD-10-CM | POA: Diagnosis not present

## 2018-09-27 ENCOUNTER — Ambulatory Visit (INDEPENDENT_AMBULATORY_CARE_PROVIDER_SITE_OTHER): Payer: 59 | Admitting: Psychology

## 2018-09-27 ENCOUNTER — Ambulatory Visit: Payer: 59 | Admitting: Psychology

## 2018-09-27 DIAGNOSIS — F331 Major depressive disorder, recurrent, moderate: Secondary | ICD-10-CM | POA: Diagnosis not present

## 2018-10-03 ENCOUNTER — Ambulatory Visit (INDEPENDENT_AMBULATORY_CARE_PROVIDER_SITE_OTHER): Payer: 59 | Admitting: Psychology

## 2018-10-03 DIAGNOSIS — F331 Major depressive disorder, recurrent, moderate: Secondary | ICD-10-CM | POA: Diagnosis not present

## 2019-04-05 ENCOUNTER — Institutional Professional Consult (permissible substitution): Payer: 59 | Admitting: Neurology

## 2019-04-19 NOTE — Progress Notes (Signed)
SLEEP MEDICINE CLINIC    Provider:  Larey Seat, MD  Primary Care Physician:  Leanna Battles, MD [place] [address]     Referring Provider: Leanna Battles, [place], [place] [address]          Chief Complaint according to patient   Patient presents with:    . New Patient (Initial Visit)           HISTORY OF PRESENT ILLNESS:  Tonya Curry is a 48 y.o. year old White or Caucasian female patient seen here as a referral on 04/20/2019 from Dr. Philip Aspen  for an evaluation of chronic insomnia.   Chief concern according to patient : " I am snoring and my husband had witnessed apneas-  but I physically have to be in my bed to sleep, and its not easy to sleep" .   I have the pleasure of seeing Tonya Curry today, a right-handed [place] or Caucasian female originally from [place] with a possible sleep disorder.  She has a  has a past medical history of ADD (attention deficit disorder), Migraines, Multiple sclerosis ([place]), and Situational depression..   The patient had no previous sleep study. Anxiety on Xanax and Abilify, but has been exacerbated during the corona virus crisis.  Sleep relevant medical history:   She is a sleep talker - Tonsillectomy and adenoid age 52 ,  No cervical spine surgery, no  deviated septum repair or UPPP. She began snoring after the birth of her first baby, and husband mark noted pauses in breathing. She was gaining extraordinary amount of weight, less so in her second pregnancy. Postpartum MS diagnosis. Had a third pregnancy with a miscarriage at 10 weeks. Had long term hot flushes after that, [facility]. Kidneystones.     Family medical /sleep history: Sleep walking in her twin brother Jeff,,her  father  At 10 years of age, triple  bypass surgery 15 years ago- at age is the only other family member on CPAP with OSA,  paternalmale relatives all died young of cardiovascular disease in their 26s.  Twin brother Merry Proud has likely apnea  and is a Insurance underwriter with hypersomnia (!), another , elder brother Shanon Brow has hypersomnia. HTN, DM and high cholesterol in both parents.   No insomnia.  Social history:  Patient is working as Engineer, maintenance (IT), married with 2 living children. and lives in a household with 4 people, 2 dogs. The patient currently works from home.  Tobacco use? inC ollege - briefly-.  ETOH use seldomly.  Caffeine intake in form of Coffee( 2) Soda( none ) Tea ( none ) nor energy drinks. Regular exercise in form of gardening, walking 4 times a week. .   Hobbies  Gardening- reading.      Sleep habits are as follows: The patient's dinner time is between 6.30 PM.  She cleans the kitchen, but husband cooks, watches TV later.  The patient goes to bed at 10 PM , it helps to read.  She sleeps with the help of melatonin, continues to sleep for 4-6  hours, she  wakes between 2-3 AM , but her mind may be racing. Not waking for bathroom breaks.The preferred sleep position is on her side, left more than right , with the support of 2 pillows. Dreams are reportedly rare but vivid.  6  AM is the usual rise time. The patient wakes up spontaneously/ but has a back up at 7 AM by  alarm.   She reports not  feeling refreshed or restored in AM, with symptoms such as dry mouth ,blurred vision and residual fatigue. TST is about 6 hours.   Naps are taken infrequently, on weekends,  lasting from 60 to 120 minutes and she feels refreshed- but it interferes with nocturnal sleep.    Review of Systems: Out of a complete 14 system review, the patient complains of only the following symptoms, and all other reviewed systems are negative.:  Fatigue, sleepiness , snoring, fragmented sleep, Insomnia. Migraines in clusters, onset at age 31  With menarche, time of ovulation , clusters of 5-10 migraines a month, better on TPM.    How likely are you to doze in the following situations: 0 = not likely, 1 = slight chance, 2 = moderate chance, 3 = high chance   Sitting and  Reading? Watching Television? Sitting inactive in a public place (theater or meeting)? As a passenger in a car for an hour without a break? Lying down in the afternoon when circumstances permit? Sitting and talking to someone? Sitting quietly after lunch without alcohol? In a car, while stopped for a few minutes in traffic?   Total = 7/ 24 points   FSS endorsed at 60 / 63 points.   Social History   Socioeconomic History  . Marital status: Married    Spouse name: Not on file  . Number of children: Not on file  . Years of education: Not on file  . Highest education level: Not on file  Occupational History  . Not on file  Social Needs  . Financial resource strain: Not on file  . Food insecurity:    Worry: Not on file    Inability: Not on file  . Transportation needs:    Medical: Not on file    Non-medical: Not on file  Tobacco Use  . Smoking status: Former Research scientist (life sciences)  . Smokeless tobacco: Never Used  Substance and Sexual Activity  . Alcohol use: Yes    Comment: occ  . Drug use: No  . Sexual activity: Not on file  Lifestyle  . Physical activity:    Days per week: Not on file    Minutes per session: Not on file  . Stress: Not on file  Relationships  . Social connections:    Talks on phone: Not on file    Gets together: Not on file    Attends religious service: Not on file    Active member of club or organization: Not on file    Attends meetings of clubs or organizations: Not on file    Relationship status: Not on file  Other Topics Concern  . Not on file  Social History Narrative  . Not on file    Family History  Problem Relation Age of Onset  . Hypertension Mother   . Diabetes Father   . Heart disease Father   . Sleep apnea Father   . High Cholesterol Father   . High Cholesterol Brother   . High Cholesterol Maternal Grandfather   . Hypertension Maternal Grandfather   . Diabetes Paternal Grandmother   . Heart disease Paternal Grandfather   . High  Cholesterol Paternal Grandfather   . Diabetes Paternal Grandfather     Past Medical History:  Diagnosis Date  . ADD (attention deficit disorder)   . Migraines   . Multiple sclerosis ([place])   . Situational depression     Past Surgical History:  Procedure Laterality Date  . DILATION AND CURETTAGE OF UTERUS  2008  .  PILONIDAL CYST EXCISION  1998     Current Outpatient Medications on File Prior to Visit  Medication Sig Dispense Refill  . DULoxetine (CYMBALTA) 60 MG capsule Take 60 mg by mouth daily.    Marland Kitchen topiramate (TOPAMAX) 50 MG tablet Take 50 mg by mouth at bedtime.     Marland Kitchen zolmitriptan (ZOMIG) 5 MG tablet Take 5 mg by mouth as needed for migraine.     No current facility-administered medications on file prior to visit.     Not on File  Physical exam:  Today's Vitals   04/20/19 0912  BP: 107/70  Pulse: 87  Temp: 98.6 F (37 C)  Weight: 134 lb (60.8 kg)  Height: 5' 3.5" (1.613 m)   Body mass index is 23.36 kg/m.   Wt Readings from Last 3 Encounters:  04/20/19 134 lb (60.8 kg)  02/24/18 135 lb 8 oz (61.5 kg)  12/16/17 136 lb (61.7 kg)     Ht Readings from Last 3 Encounters:  04/20/19 5' 3.5" (1.613 m)  02/24/18 5' 2.5" (1.588 m)  12/16/17 5' 2.5" (1.588 m)      General: The patient is awake, alert and appears not in acute distress. The patient is well groomed. Head: Normocephalic, atraumatic. Neck is supple. Mallampati  3/ 13 " ,  neck circumference:13'  inches . Nasal airflow feels to her as if not patent, but she is able to breath- with restriction. .  Retrognathia is not  seen.  Dental status:  Cardiovascular:  Regular rate and cardiac rhythm by pulse,  without distended neck veins. Respiratory: Lungs are clear to auscultation.  Skin:  Without evidence of ankle edema, or rash. Trunk: The patient's posture is erect.   Neurologic exam : The patient is awake and alert, oriented to place and time.   Memory subjective described as intact.  Attention span &  concentration ability appears normal.  Speech is fluent,  without  dysarthria, dysphonia or aphasia.  Mood and affect are appropriate.   Cranial nerves: no loss of smell or taste reported  Pupils are equal and briskly reactive to light. Funduscopic exam deferred. She reports blurred vision .  Extraocular movements in vertical and horizontal planes were intact and without nystagmus.  No Diplopia. Visual fields by finger perimetry are intact. Hearing was intact to soft voice and finger rubbing.  Facial sensation intact to fine touch. Facial motor strength is symmetric and tongue and uvula move midline.  Neck ROM : rotation, tilt and flexion extension were normal for age and shoulder shrug was symmetrical.    Motor exam:  Symmetric bulk, tone and ROM.   Normal tone without cog wheeling, symmetric grip strength .   Sensory:  Fine touch, pinprick and vibration were tested  and  normal.  Proprioception tested in the upper extremities was normal.   Coordination: Rapid alternating movements in the fingers/hands were of normal speed.  The Finger-to-nose maneuver was intact without evidence of ataxia, dysmetria or tremor.   Gait and station: Patient could rise unassisted from a seated position. Toe and heel walk were deferred.  Deep tendon reflexes: in the  upper and lower extremities are symmetric and intact.  Babinski response was deferred.       After spending a total time of  40  minutes face to face and additional time for physical and neurologic examination, review of laboratory studies,  personal review of imaging studies, reports and results of other testing and review of referral information / records as far  as provided in visit, I have established the following assessments:  1) Insomnia has been ameliorated by taking Melatonin - she is still not refreshed, restored after 6-7 hours of sleep   2) high degree of fatigue , not sleepiness.   3) Headaches are not sleep related- migraines  are katamenial.   My Plan is to proceed with:  1) Attended sleep study - look at origin of arousals in early morning, snoring and central apnea.   2) fatigue from chronic autoimmune disease. Anxiety/ depression.   3)  Migraine frequency should be treatable to [place], Emgality.   I would like to thank Leanna Battles, MD and Leanna Battles, [address] [place], [place] [address] for allowing me to meet with and to take care of this pleasant patient.   In short, Linley Moskal is presenting with non restorative sleep , a symptom that can be attributed to anxiaty, MS , and sleep disordered breathing.   I plan to follow up either personally or through our NP within 3 month.   CC: I will share my notes with PCP, Dr Krista Blue   Electronically signed by:   Larey Seat, MD 04/20/2019 9:17 AM  [facility] Board certified by The [employer] of Sleep Medicine and Diplomate of the [employer] of Sleep Medicine. Board certified In Neurology through the [place], Fellow of the [employer] of Neurology. Medical Director of [facility].

## 2019-04-20 ENCOUNTER — Encounter: Payer: Self-pay | Admitting: Neurology

## 2019-04-20 ENCOUNTER — Ambulatory Visit (INDEPENDENT_AMBULATORY_CARE_PROVIDER_SITE_OTHER): Payer: 59 | Admitting: Neurology

## 2019-04-20 ENCOUNTER — Other Ambulatory Visit: Payer: Self-pay

## 2019-04-20 VITALS — BP 107/70 | HR 87 | Temp 98.6°F | Ht 63.5 in | Wt 134.0 lb

## 2019-04-20 DIAGNOSIS — G4733 Obstructive sleep apnea (adult) (pediatric): Secondary | ICD-10-CM | POA: Diagnosis not present

## 2019-04-20 DIAGNOSIS — G35 Multiple sclerosis: Secondary | ICD-10-CM

## 2019-04-20 DIAGNOSIS — G43709 Chronic migraine without aura, not intractable, without status migrainosus: Secondary | ICD-10-CM | POA: Diagnosis not present

## 2019-04-20 DIAGNOSIS — IMO0002 Reserved for concepts with insufficient information to code with codable children: Secondary | ICD-10-CM

## 2019-04-20 NOTE — Patient Instructions (Signed)

## 2019-04-25 ENCOUNTER — Other Ambulatory Visit: Payer: Self-pay | Admitting: Neurology

## 2019-04-25 ENCOUNTER — Telehealth: Payer: Self-pay

## 2019-04-25 DIAGNOSIS — G43709 Chronic migraine without aura, not intractable, without status migrainosus: Secondary | ICD-10-CM

## 2019-04-25 DIAGNOSIS — G4733 Obstructive sleep apnea (adult) (pediatric): Secondary | ICD-10-CM

## 2019-04-25 DIAGNOSIS — G35 Multiple sclerosis: Secondary | ICD-10-CM

## 2019-04-25 DIAGNOSIS — IMO0002 Reserved for concepts with insufficient information to code with codable children: Secondary | ICD-10-CM

## 2019-04-25 NOTE — Telephone Encounter (Signed)
PSG was denied by [employer]. Need HST order

## 2019-04-25 NOTE — Telephone Encounter (Signed)
Order placed

## 2019-05-17 ENCOUNTER — Ambulatory Visit (INDEPENDENT_AMBULATORY_CARE_PROVIDER_SITE_OTHER): Payer: 59 | Admitting: Neurology

## 2019-05-17 DIAGNOSIS — G35 Multiple sclerosis: Secondary | ICD-10-CM

## 2019-05-17 DIAGNOSIS — G4733 Obstructive sleep apnea (adult) (pediatric): Secondary | ICD-10-CM

## 2019-05-17 DIAGNOSIS — G43709 Chronic migraine without aura, not intractable, without status migrainosus: Secondary | ICD-10-CM

## 2019-05-17 DIAGNOSIS — IMO0002 Reserved for concepts with insufficient information to code with codable children: Secondary | ICD-10-CM

## 2019-05-30 NOTE — Procedures (Signed)
  Patient Information     First Name: Tonya Last Name: Curry Hinesley: [id number]  Birth Date: Jan 19, 1971 Age: 48 Gender: Female  Referring Provider: Ronnald Nian, DO BMI: 23.8 (W=134 lb, H=5' 3'')  Neck Circ.:  13 '' Epworth:  7/24   Sleep Study Information    Study Date: May 17, 2019 S/H/A Version: 001.001.001.001 / 4.1.1528 / 77  History:     [name] is a 48 y.o. year old White or Caucasian female patient seen here as a referral on 04/20/2019 from Dr. Philip Aspen for an evaluation of chronic insomnia. Sofija Antwi, a right-handed [place] or Caucasian female originally from [place], is to be evaluated for possible organic causes to a chronic sleep disorder.  She has a past medical history of ADD (attention deficit disorder), Migraines, Multiple sclerosis ([place]), and Situational depression.                 Summary & Diagnosis:     This HST indicates no insomnia to be present, the calculated total sleep time was well over 8 hours, there was no sleep disordered breathing and no hypoxia, tachy-bradycardia noted. The patient endorsed no excessive daytime sleepiness.  Recommendations:     No indication of sleep apnea, no indication of insomnia. A follow up with the sleep clinic is not needed.   Larey Seat, MD   05-30-2019                         Start Study Time: End Study Time: Total Recording Time:  9:53:32 PM 7:37:20 AM      9 h, 43 min  Total Sleep Time % REM of Sleep Time:  8 h, 21 min 15.6    Mean: 96 Minimum: 94 Maximum: 100  Mean of Desaturations Nadirs (%):   83  Oxygen Desaturation %:  4-9 10-20 >20 Total  Events Number Total   1 50.0 0 1  0.0 50.0  2 100.0  Oxygen Saturation: <90 <=88  <85 <80 <70  Duration (minutes): Sleep % 0.0 0.0 0.0 0.0  0.0 0.0  0.0 0.0 0.0 0.0     Respiratory Indices       Total Events REM NREM All Night  pRDI: pAHI: ODI:  12  6  2  5.0 2.5 0.8 1.0 0.5 0.2 1.6 0.8 0.3       Pulse Rate Statistics  during Sleep (BPM)      Mean: 69 Minimum: 59 Maximum: 100    Sleep Summary  Oxygen Saturation Statistics   Indices are calculated using technically valid sleep time of  7 hrs, 18 min. pRDI/pAHI are calculated using oxi desaturations ? 3%                 Sleep Stages Chart

## 2019-05-31 ENCOUNTER — Telehealth: Payer: Self-pay | Admitting: Neurology

## 2019-05-31 NOTE — Telephone Encounter (Signed)
-----   Message from Larey Seat, MD sent at 05/30/2019  5:33 PM EDT ----- Summary & Diagnosis:    This HST indicates no insomnia to be present, the calculated  total sleep time was well over 8 hours, there was no sleep  disordered breathing and no hypoxia, tachy-bradycardia noted. The  patient endorsed no excessive daytime sleepiness.  Recommendations:    No indication of sleep apnea, no indication of insomnia. A follow  up with the sleep clinic is not needed.   Larey Seat, MD  05-30-2019

## 2019-05-31 NOTE — Telephone Encounter (Signed)
Called patient to discuss sleep study results. No answer at this time. LVM for the patient to call back.   

## 2019-06-13 NOTE — Telephone Encounter (Signed)
Made a second attempt to call the patient and review the sleep study results. No answer. LVM for the pt to call back

## 2019-06-15 ENCOUNTER — Encounter: Payer: Self-pay | Admitting: Neurology

## 2019-08-03 ENCOUNTER — Ambulatory Visit: Payer: 59 | Admitting: Adult Health

## 2020-01-21 ENCOUNTER — Ambulatory Visit: Payer: 59 | Attending: Internal Medicine

## 2020-01-21 DIAGNOSIS — Z23 Encounter for immunization: Secondary | ICD-10-CM | POA: Insufficient documentation

## 2020-01-21 NOTE — Progress Notes (Signed)
   Covid-19 Vaccination Clinic  Name:  Tonya Curry    MRN: [id number] DOB: 1971-04-16  01/21/2020  [name] was observed post Covid-19 immunization for 15 minutes without incident. She was provided with Vaccine Information Sheet and instruction to access the V-Safe system.   [name] was instructed to call 911 with any severe reactions post vaccine: Marland Kitchen Difficulty breathing  . Swelling of face and throat  . A fast heartbeat  . A bad rash all over body  . Dizziness and weakness   Immunizations Administered    Name Date Dose VIS Date Route   Pfizer COVID-19 Vaccine 01/21/2020 10:12 AM 0.3 mL 10/27/2019 Intramuscular   Manufacturer: [place]   Lot: [id number]   [place]: [id number]

## 2020-02-20 ENCOUNTER — Ambulatory Visit: Payer: 59 | Attending: Internal Medicine

## 2020-02-20 DIAGNOSIS — Z23 Encounter for immunization: Secondary | ICD-10-CM

## 2020-02-20 NOTE — Progress Notes (Signed)
   Covid-19 Vaccination Clinic  Name:  Tonya Curry    MRN: [id number] DOB: [birth year]  02/20/2020  Ms. Clendennen was observed post Covid-19 immunization for 30 minutes based on pre-vaccination screening without incident. She was provided with Vaccine Information Sheet and instruction to access the V-Safe system.   Ms. Hinks was instructed to call 911 with any severe reactions post vaccine: Marland Kitchen Difficulty breathing  . Swelling of face and throat  . A fast heartbeat  . A bad rash all over body  . Dizziness and weakness   Immunizations Administered    Name Date Dose VIS Date Route   Pfizer COVID-19 Vaccine 02/20/2020 12:26 PM 0.3 mL 10/27/2019 Intramuscular   Manufacturer: [place]   Lot: [id number]   [place]: [id number]

## 2020-02-21 ENCOUNTER — Ambulatory Visit: Payer: Self-pay

## 2020-11-23 ENCOUNTER — Emergency Department
Admission: EM | Admit: 2020-11-23 | Discharge: 2020-11-23 | Disposition: A | Discharge status: Home / Self Care | Payer: 59 | Attending: Emergency Medicine | Admitting: Emergency Medicine

## 2020-11-23 ENCOUNTER — Other Ambulatory Visit: Payer: Self-pay

## 2020-11-23 ENCOUNTER — Emergency Department: Payer: 59

## 2020-11-23 DIAGNOSIS — Z5321 Procedure and treatment not carried out due to patient leaving prior to being seen by health care provider: Secondary | ICD-10-CM | POA: Diagnosis not present

## 2020-11-23 DIAGNOSIS — G43909 Migraine, unspecified, not intractable, without status migrainosus: Secondary | ICD-10-CM | POA: Insufficient documentation

## 2020-11-23 MED ORDER — PROCHLORPERAZINE EDISYLATE 10 MG/2ML IJ SOLN
10.0000 mg | Freq: Once | INTRAMUSCULAR | Status: AC
  Administered 2020-11-23: 10 mg via INTRAVENOUS

## 2020-11-23 MED ORDER — SODIUM CHLORIDE 0.9 % IV BOLUS
1000.0000 mL | Freq: Once | INTRAVENOUS | Status: AC
  Administered 2020-11-23: 1000 mL via INTRAVENOUS

## 2020-11-23 NOTE — ED Notes (Addendum)
D&C IV 

## 2020-11-23 NOTE — ED Triage Notes (Addendum)
Pt states worst headache she has ever had. Pt states she was driving began to experience facial tingling and leg cramping with headache. Pt with history of migraines. Pt currently with equal strength in extremities. Pt with equal sensation to face. perrl 58mm. Face symmetrical, speech clear.

## 2020-11-23 NOTE — ED Notes (Signed)
Pt does not wish to stay to be seen, IV access removed. Pt left with family member

## 2020-11-23 NOTE — ED Triage Notes (Signed)
FIRST NURSE NOTE: Pt here via [facility] with reports of migraine all day, no migraine meds left, c/o nausea, 20G IV placed by EMS, Zofran given.  VSS per EMS.   Pt reported to EMS that the patient c/o bilateral hands tingling while driving.

## 2021-04-30 ENCOUNTER — Encounter: Payer: Self-pay | Admitting: Internal Medicine

## 2021-06-24 ENCOUNTER — Other Ambulatory Visit: Payer: Self-pay

## 2021-06-24 ENCOUNTER — Ambulatory Visit (AMBULATORY_SURGERY_CENTER): Payer: 59

## 2021-06-24 VITALS — Ht 63.0 in | Wt 145.0 lb

## 2021-06-24 DIAGNOSIS — Z1211 Encounter for screening for malignant neoplasm of colon: Secondary | ICD-10-CM

## 2021-06-24 MED ORDER — PEG-KCL-NACL-NASULF-NA ASC-C 100 G PO SOLR: 1.0000 | Freq: Once | ORAL | 0 refills | Status: AC

## 2021-06-24 NOTE — Progress Notes (Signed)
Pre visit completed via phone call; Patient verified name, DOB, and address; No egg or soy allergy known to patient  No issues with past sedation with any surgeries or procedures Patient denies ever being told they had issues or difficulty with intubation  No FH of Malignant Hyperthermia No diet pills per patient No home 02 use per patient  No blood thinners per patient  Pt reports issues with constipation -patient reports she takes Dulcolax stool softener; No A fib or A flutter  EMMI video via [place] 19 guidelines implemented in PV today with Pt and RN  Pt is fully vaccinated for Covid  NO PA's for preps discussed with pt in PV today  Discussed with pt there will be an out-of-pocket cost for prep and that varies from $0 to 70 dollars  Due to the COVID-19 pandemic we are asking patients to follow certain guidelines.  Pt aware of COVID protocols and [facility] guidelines

## 2021-07-10 ENCOUNTER — Other Ambulatory Visit: Payer: Self-pay

## 2021-07-10 ENCOUNTER — Ambulatory Visit (AMBULATORY_SURGERY_CENTER): Payer: 59 | Admitting: Internal Medicine

## 2021-07-10 ENCOUNTER — Encounter: Payer: Self-pay | Admitting: Internal Medicine

## 2021-07-10 VITALS — BP 106/64 | HR 61 | Temp 98.0°F | Resp 14 | Ht 63.0 in | Wt 145.0 lb

## 2021-07-10 DIAGNOSIS — Z1211 Encounter for screening for malignant neoplasm of colon: Secondary | ICD-10-CM | POA: Diagnosis not present

## 2021-07-10 DIAGNOSIS — D124 Benign neoplasm of descending colon: Secondary | ICD-10-CM

## 2021-07-10 DIAGNOSIS — D122 Benign neoplasm of ascending colon: Secondary | ICD-10-CM

## 2021-07-10 MED ORDER — SODIUM CHLORIDE 0.9 % IV SOLN: 500.0000 mL | Freq: Once | INTRAVENOUS | Status: DC

## 2021-07-10 NOTE — Patient Instructions (Signed)
Handout given for polyps.  YOU HAD AN ENDOSCOPIC PROCEDURE TODAY AT THE [facility]:   Refer to the procedure report that was given to you for any specific questions about what was found during the examination.  If the procedure report does not answer your questions, please call your gastroenterologist to clarify.  If you requested that your care partner not be given the details of your procedure findings, then the procedure report has been included in a sealed envelope for you to review at your convenience later.  YOU SHOULD EXPECT: Some feelings of bloating in the abdomen. Passage of more gas than usual.  Walking can help get rid of the air that was put into your GI tract during the procedure and reduce the bloating. If you had a lower endoscopy (such as a colonoscopy or flexible sigmoidoscopy) you may notice spotting of blood in your stool or on the toilet paper. If you underwent a bowel prep for your procedure, you may not have a normal bowel movement for a few days.  Please Note:  You might notice some irritation and congestion in your nose or some drainage.  This is from the oxygen used during your procedure.  There is no need for concern and it should clear up in a day or so.  SYMPTOMS TO REPORT IMMEDIATELY:   Following lower endoscopy (colonoscopy or flexible sigmoidoscopy):  Excessive amounts of blood in the stool  Significant tenderness or worsening of abdominal pains  Swelling of the abdomen that is new, acute  Fever of 100F or higher  For urgent or emergent issues, a gastroenterologist can be reached at any hour by calling [phone]. Do not use MyChart messaging for urgent concerns.    DIET:  We do recommend a small meal at first, but then you may proceed to your regular diet.  Drink plenty of fluids but you should avoid alcoholic beverages for 24 hours.  ACTIVITY:  You should plan to take it easy for the rest of today and you should NOT DRIVE or use heavy  machinery until tomorrow (because of the sedation medicines used during the test).    FOLLOW UP: Our staff will call the number listed on your records 48-72 hours following your procedure to check on you and address any questions or concerns that you may have regarding the information given to you following your procedure. If we do not reach you, we will leave a message.  We will attempt to reach you two times.  During this call, we will ask if you have developed any symptoms of COVID 19. If you develop any symptoms (ie: fever, flu-like symptoms, shortness of breath, cough etc.) before then, please call [phone].  If you test positive for Covid 19 in the 2 weeks post procedure, please call and report this information to us.    If any biopsies were taken you will be contacted by phone or by letter within the next 1-3 weeks.  Please call us at [phone] if you have not heard about the biopsies in 3 weeks.    SIGNATURES/CONFIDENTIALITY: You and/or your care partner have signed paperwork which will be entered into your electronic medical record.  These signatures attest to the fact that that the information above on your After Visit Summary has been reviewed and is understood.  Full responsibility of the confidentiality of this discharge information lies with you and/or your care-partner. 

## 2021-07-10 NOTE — Progress Notes (Signed)
Called to room to assist during endoscopic procedure.  Patient ID and intended procedure confirmed with present staff. Received instructions for my participation in the procedure from the performing physician.  

## 2021-07-10 NOTE — Progress Notes (Signed)
PT taken to PACU. Monitors in place. VSS. Report given to RN. 

## 2021-07-10 NOTE — Op Note (Signed)
Tonya Curry: Tonya Curry Procedure Date: 07/10/2021 3:04 PM MRN: [id number] Endoscopist: Docia Chuck. Henrene Pastor , MD Age: 50 Referring MD:  Date of Birth: January 24, 1971 Gender: Female Account #: [id number] Procedure:                Colonoscopy cold snare polypectomy x 2 Indications:              Screening for colorectal malignant neoplasm Medicines:                Monitored Anesthesia Care Procedure:                Pre-Anesthesia Assessment:                           - Prior to the procedure, a History and Physical                            was performed, and patient medications and                            allergies were reviewed. The patient's tolerance of                            previous anesthesia was also reviewed. The risks                            and benefits of the procedure and the sedation                            options and risks were discussed with the patient.                            All questions were answered, and informed consent                            was obtained. Prior Anticoagulants: The patient has                            taken no previous anticoagulant or antiplatelet                            agents. ASA Grade Assessment: I - A normal, healthy                            patient. After reviewing the risks and benefits,                            the patient was deemed in satisfactory condition to                            undergo the procedure.                           After obtaining informed consent, the colonoscope  was passed under direct vision. Throughout the                            procedure, the patient's blood pressure, pulse, and                            oxygen saturations were monitored continuously. The                            CF [id number] was introduced through the anus                            and advanced to the the cecum, identified by                             appendiceal orifice and ileocecal valve. The                            ileocecal valve, appendiceal orifice, and rectum                            were photographed. The quality of the bowel                            preparation was excellent. The colonoscopy was                            performed without difficulty. The patient tolerated                            the procedure well. The bowel preparation used was                            MoviPrep via split dose instruction. Scope In: 3:26:04 PM Scope Out: 3:45:03 PM Scope Withdrawal Time: 0 hours 13 minutes 16 seconds  Total Procedure Duration: 0 hours 18 minutes 59 seconds  Findings:                 Two polyps were found in the descending colon and                            ascending colon. The polyps were 3 to 4 mm in size.                            These polyps were removed with a cold snare.                            Resection and retrieval were complete.                           The exam was otherwise without abnormality on                            direct and retroflexion  views. Complications:            No immediate complications. Estimated blood loss:                            None. Estimated Blood Loss:     Estimated blood loss: none. Impression:               - Two 3 to 4 mm polyps in the descending colon and                            in the ascending colon, removed with a cold snare.                            Resected and retrieved.                           - The examination was otherwise normal on direct                            and retroflexion views. Recommendation:           - Repeat colonoscopy in 7-10 years for surveillance.                           - Patient has a contact number available for                            emergencies. The signs and symptoms of potential                            delayed complications were discussed with the                            patient. Return to normal  activities tomorrow.                            Written discharge instructions were provided to the                            patient.                           - Resume previous diet.                           - Continue present medications.                           - Await pathology results. Docia Chuck. Henrene Pastor, MD 07/10/2021 3:53:10 PM This report has been signed electronically.

## 2021-07-14 ENCOUNTER — Telehealth: Payer: Self-pay

## 2021-07-14 ENCOUNTER — Telehealth: Payer: Self-pay | Admitting: *Deleted

## 2021-07-14 NOTE — Telephone Encounter (Signed)
  Follow up Call-  Call back number 07/10/2021  Post procedure Call Back phone  # [phone]  Permission to leave phone message Yes  Some recent data might be hidden    No answer at 2nd attempt follow up phone call.  Unable to leave a message d/t full mailbox.

## 2021-07-14 NOTE — Telephone Encounter (Signed)
No answer, left message to call back later today, B.Levetta Bognar RN. 

## 2021-07-18 ENCOUNTER — Encounter: Payer: Self-pay | Admitting: Internal Medicine

## 2022-09-23 DIAGNOSIS — Z79899 Other long term (current) drug therapy: Secondary | ICD-10-CM | POA: Diagnosis not present

## 2022-09-23 DIAGNOSIS — F902 Attention-deficit hyperactivity disorder, combined type: Secondary | ICD-10-CM | POA: Diagnosis not present

## 2022-10-10 IMAGING — CT CT HEAD W/O CM
3 series · 16 of 47 positions shown, 19 images · non-contrast
Comparison: None.

CLINICAL DATA: Cerebral hemorrhage suspected

Headache.
EXAM:
CT HEAD WITHOUT CONTRAST
TECHNIQUE: Contiguous axial images were obtained from the base of the skull
through the vertex without intravenous contrast.

[Series 2: head wo · axial · 0.42mm/px · z∈[+303,+428]mm · 10 of 31 slices shown, 13 images]
[im 3/31  brain]
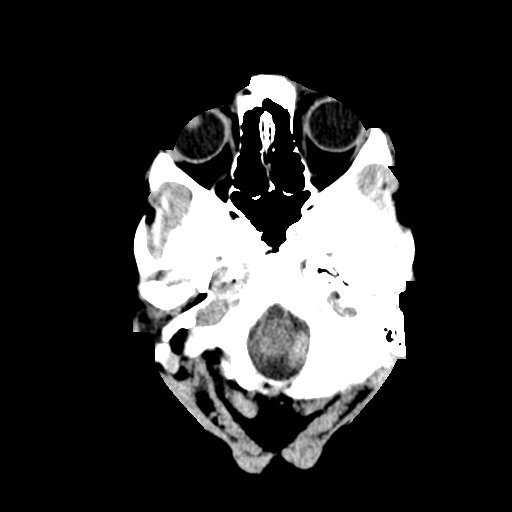
[im 3/31  bone]
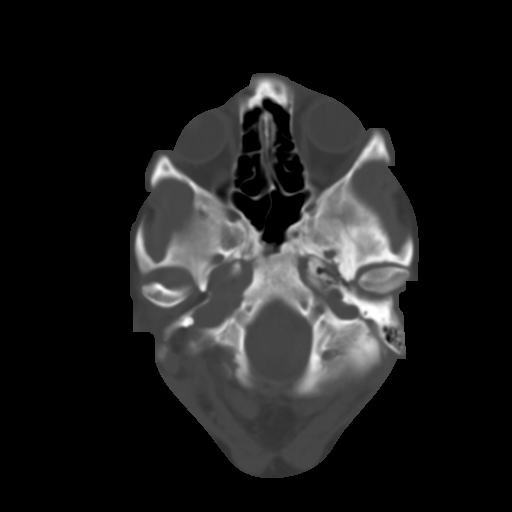
[im 6/31  brain]
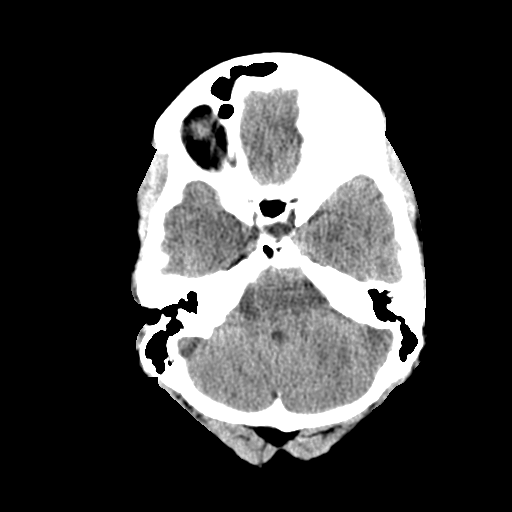
[im 9/31  brain]
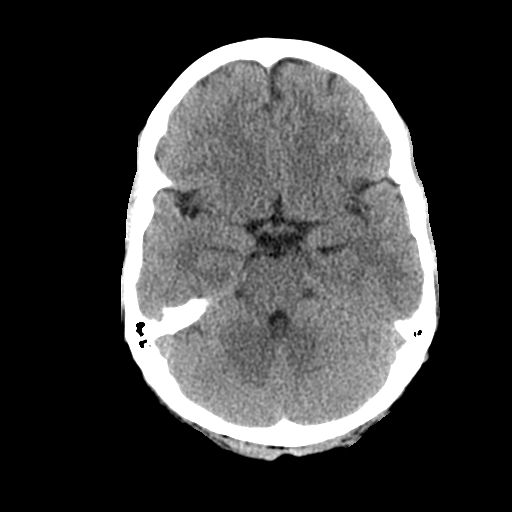
[im 11/31  brain]
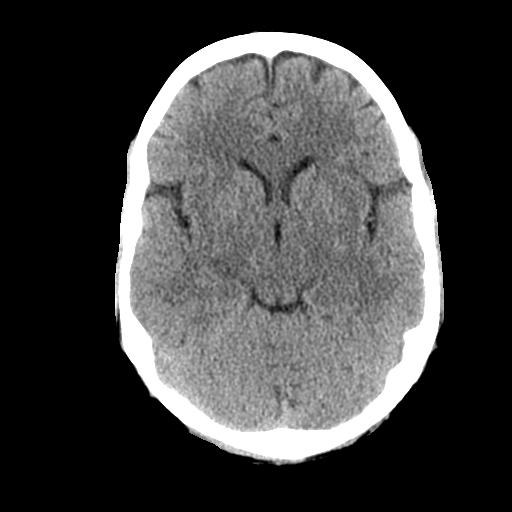
[im 14/31  brain]
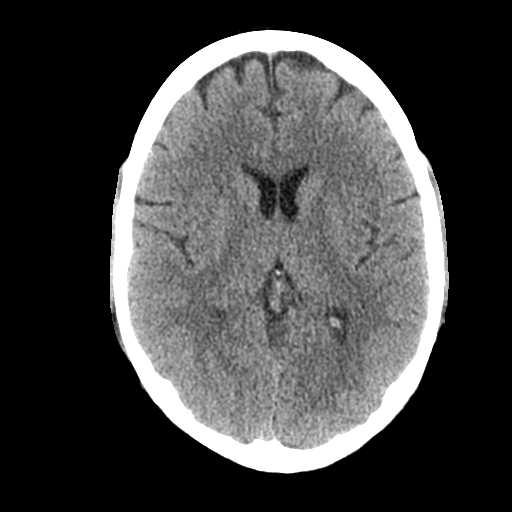
[im 14/31  bone]
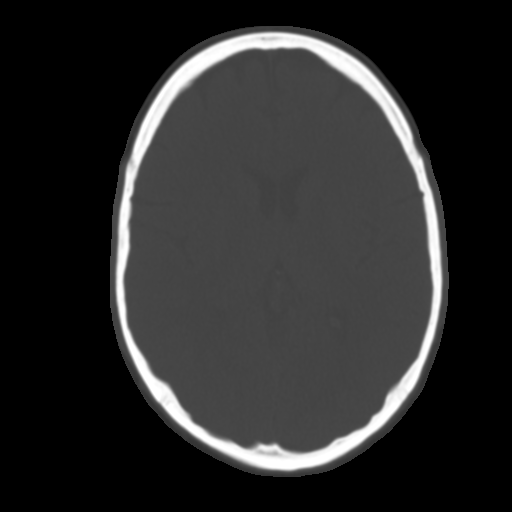
[im 17/31  brain]
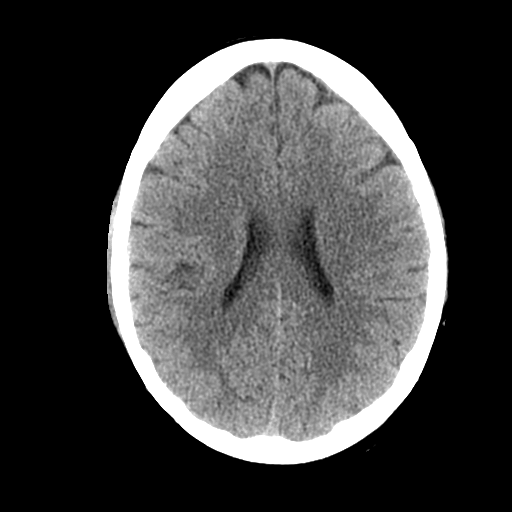
[im 20/31  brain]
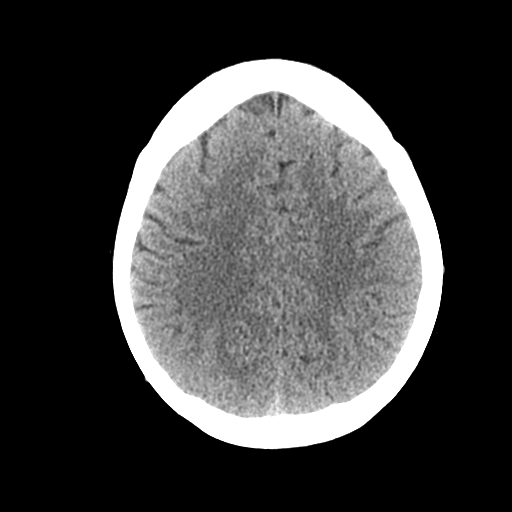
[im 23/31  brain]
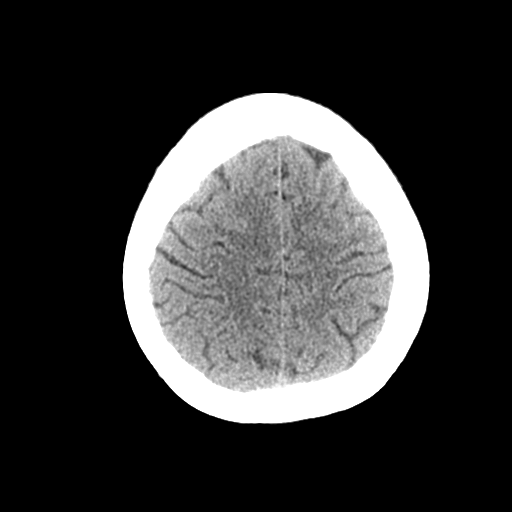
[im 25/31  brain]
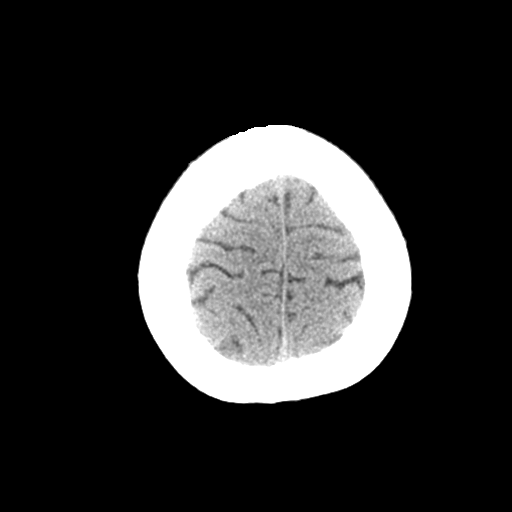
[im 25/31  bone]
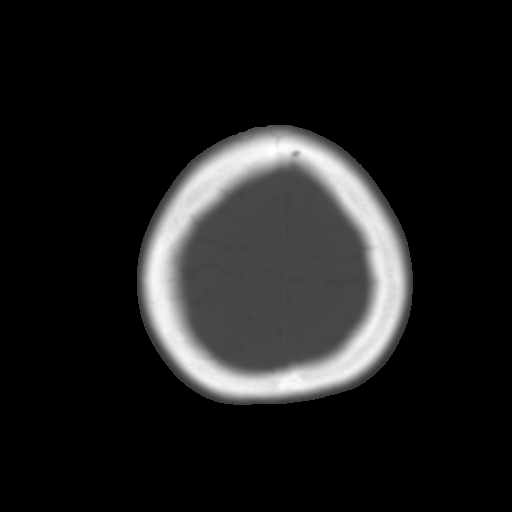
[im 28/31  brain]
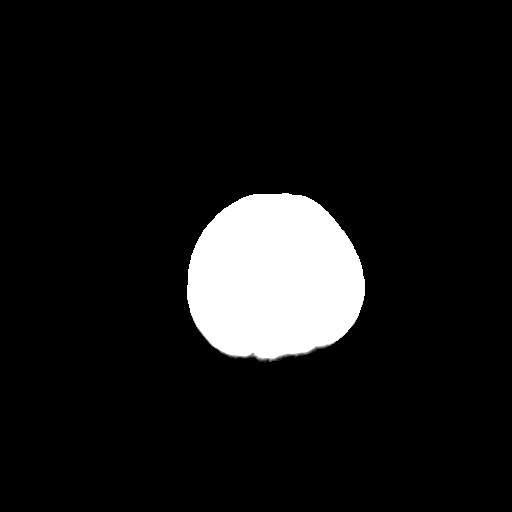

[Series 4: coronal soft tissue · coronal · 0.31mm/px · 3 of 67 slices shown]
[im 23/67  brain]
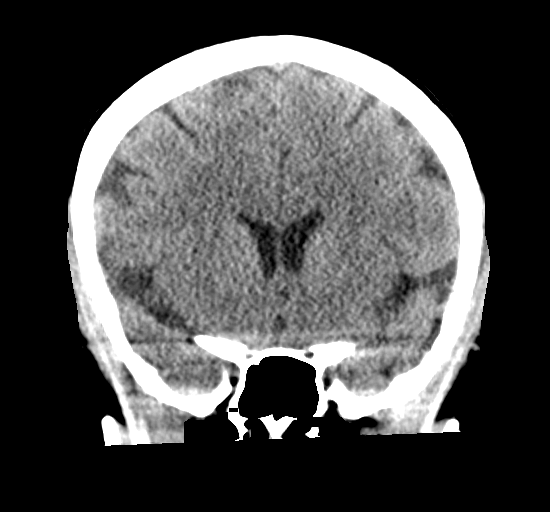
[im 30/67  brain]
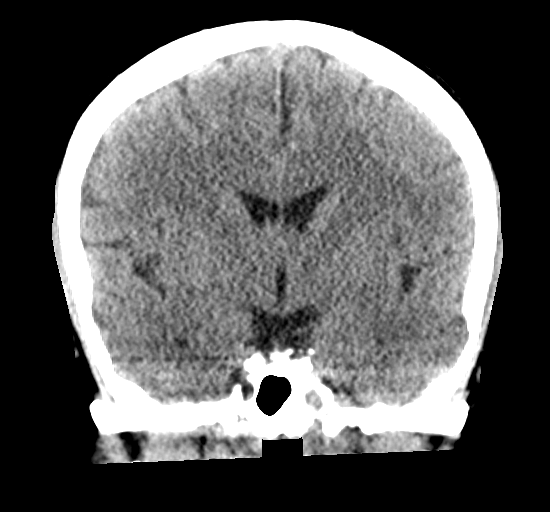
[im 37/67  brain]
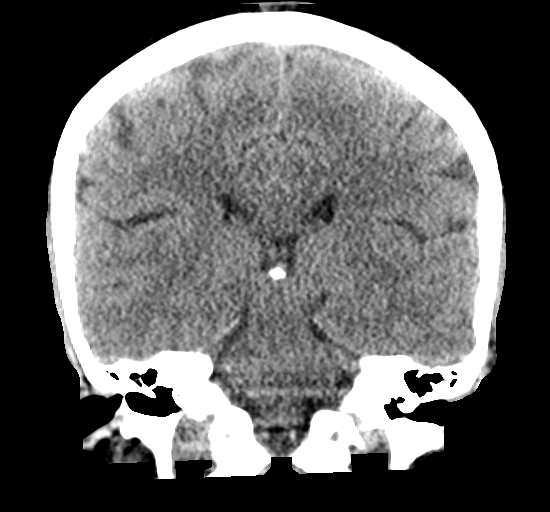

[Series 5: sagittal soft tissue · sagittal · 0.31mm/px · 3 of 53 slices shown]
[im 18/53  brain]
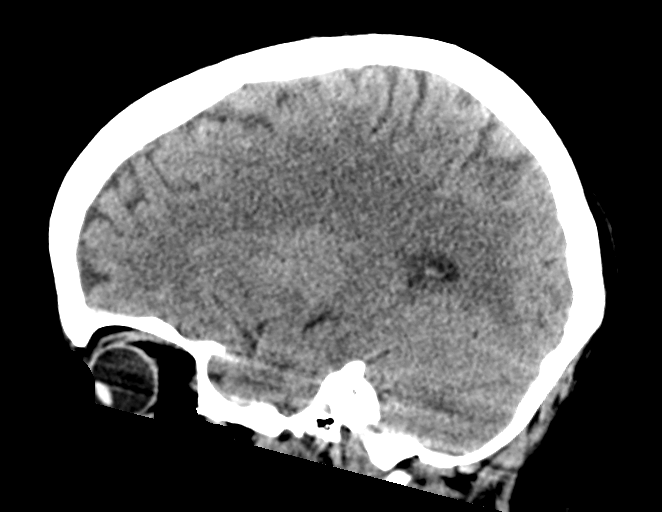
[im 27/53  brain]
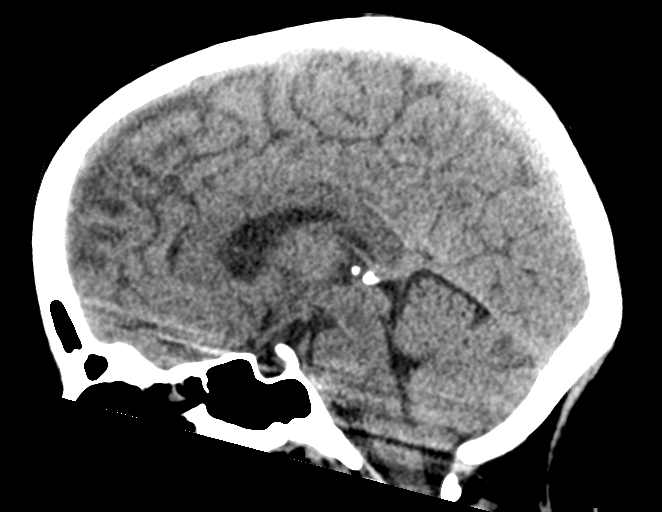
[im 35/53  brain]
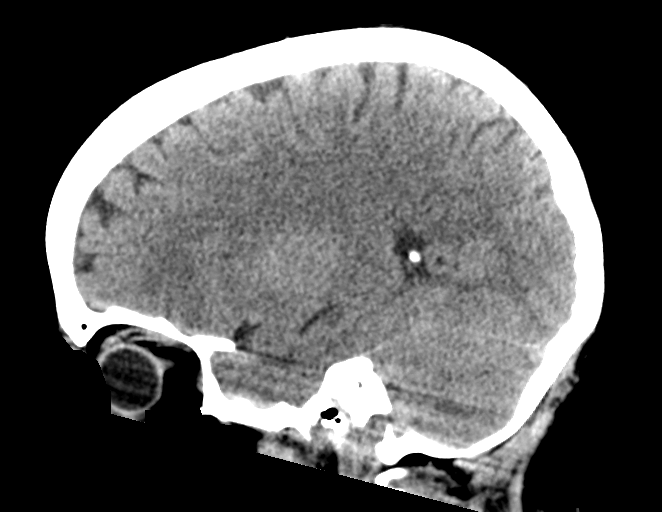

[16 of 47 positions shown; findings below may reference images not displayed]

FINDINGS: Brain: No intracranial hemorrhage, mass effect, or midline shift. No
hydrocephalus. The basilar cisterns are patent. No evidence of
territorial infarct or acute ischemia. No extra-axial or
intracranial fluid collection.

Vascular: No hyperdense vessel or unexpected calcification.

Skull: Normal. Negative for fracture or focal lesion.

Sinuses/Orbits: Paranasal sinuses and mastoid air cells are clear.
The visualized orbits are unremarkable.

Other: None.
IMPRESSION: Negative noncontrast head CT.

## 2023-03-08 DIAGNOSIS — E785 Hyperlipidemia, unspecified: Secondary | ICD-10-CM | POA: Diagnosis not present

## 2023-03-15 DIAGNOSIS — Z7689 Persons encountering health services in other specified circumstances: Secondary | ICD-10-CM | POA: Diagnosis not present

## 2023-03-16 DIAGNOSIS — G35 Multiple sclerosis: Secondary | ICD-10-CM | POA: Diagnosis not present

## 2023-03-16 DIAGNOSIS — Z1331 Encounter for screening for depression: Secondary | ICD-10-CM | POA: Diagnosis not present

## 2023-03-16 DIAGNOSIS — F900 Attention-deficit hyperactivity disorder, predominantly inattentive type: Secondary | ICD-10-CM | POA: Diagnosis not present

## 2023-03-16 DIAGNOSIS — Z0000 Encounter for general adult medical examination without abnormal findings: Secondary | ICD-10-CM | POA: Diagnosis not present

## 2023-03-16 DIAGNOSIS — R82998 Other abnormal findings in urine: Secondary | ICD-10-CM | POA: Diagnosis not present

## 2023-03-16 DIAGNOSIS — G4700 Insomnia, unspecified: Secondary | ICD-10-CM | POA: Diagnosis not present

## 2023-03-16 DIAGNOSIS — E785 Hyperlipidemia, unspecified: Secondary | ICD-10-CM | POA: Diagnosis not present

## 2023-03-17 ENCOUNTER — Other Ambulatory Visit: Payer: Self-pay | Admitting: Internal Medicine

## 2023-03-17 DIAGNOSIS — E785 Hyperlipidemia, unspecified: Secondary | ICD-10-CM

## 2023-04-23 DIAGNOSIS — Z79899 Other long term (current) drug therapy: Secondary | ICD-10-CM | POA: Diagnosis not present

## 2023-04-23 DIAGNOSIS — F902 Attention-deficit hyperactivity disorder, combined type: Secondary | ICD-10-CM | POA: Diagnosis not present

## 2023-04-26 DIAGNOSIS — F902 Attention-deficit hyperactivity disorder, combined type: Secondary | ICD-10-CM | POA: Diagnosis not present

## 2023-04-27 ENCOUNTER — Other Ambulatory Visit: Payer: Self-pay

## 2023-05-03 ENCOUNTER — Ambulatory Visit
Admission: RE | Admit: 2023-05-03 | Discharge: 2023-05-03 | Disposition: A | Discharge status: Home / Self Care | Payer: No Typology Code available for payment source | Source: Ambulatory Visit | Attending: Internal Medicine | Admitting: Internal Medicine

## 2023-05-03 DIAGNOSIS — E785 Hyperlipidemia, unspecified: Secondary | ICD-10-CM

## 2023-05-06 ENCOUNTER — Other Ambulatory Visit: Payer: Self-pay

## 2023-05-21 ENCOUNTER — Other Ambulatory Visit: Payer: Self-pay

## 2023-07-08 ENCOUNTER — Ambulatory Visit: Payer: BC Managed Care – PPO | Admitting: Neurology

## 2023-07-23 DIAGNOSIS — Z79899 Other long term (current) drug therapy: Secondary | ICD-10-CM | POA: Diagnosis not present

## 2023-07-23 DIAGNOSIS — F902 Attention-deficit hyperactivity disorder, combined type: Secondary | ICD-10-CM | POA: Diagnosis not present

## 2023-08-17 ENCOUNTER — Encounter: Payer: Self-pay | Admitting: Neurology

## 2023-08-17 ENCOUNTER — Ambulatory Visit (INDEPENDENT_AMBULATORY_CARE_PROVIDER_SITE_OTHER): Payer: BC Managed Care – PPO | Admitting: Neurology

## 2023-08-17 VITALS — BP 130/80 | HR 70 | Ht 64.0 in | Wt 128.5 lb

## 2023-08-17 DIAGNOSIS — G35 Multiple sclerosis: Secondary | ICD-10-CM | POA: Diagnosis not present

## 2023-08-17 DIAGNOSIS — R5383 Other fatigue: Secondary | ICD-10-CM | POA: Insufficient documentation

## 2023-08-17 DIAGNOSIS — G43709 Chronic migraine without aura, not intractable, without status migrainosus: Secondary | ICD-10-CM | POA: Diagnosis not present

## 2023-08-17 NOTE — Progress Notes (Signed)
Chief Complaint  Patient presents with   New Patient (Initial Visit)    Rm14, alone,  referral for MS re-evaluation:pt stated that recently she has started becoming more fatigue, right leg "on fire", eyes are having vision difficulty (blurry), foot numbness bilateral      ASSESSMENT AND PLAN  Tonya Curry is a 52 y.o. female   Relapsing remitting multiple sclerosis, Fatigue Chronic migraine  Symptoms overall stable,  On polypharmacy for her body achy pain, Cymbalta 60 mg daily, trazodone 50 mg as needed for sleep,  Adderall xr has helped her concentration and energy level  Zomig as needed for chronic migraine  Because her symptoms are stable, previous MRI of the brain was essentially normal, degenerative changes at the cervical spine, only mild signal abnormality at C4, after discussed with patient, decided not to proceed with repeating imaging study at this point, she will contact clinic for any new issues  DIAGNOSTIC DATA (LABS, IMAGING, TESTING) - I reviewed patient records, labs, notes, testing and imaging myself where available.   MEDICAL HISTORY:  Tonya Curry, seen in request by [facility] Dr. Jarome Matin to follow up on her MS and chronic migraine.    History is obtained from the patient and review of electronic medical records. I personally reviewed pertinent available imaging films in PACS.   PMHx Relapsing remitting multiple sclerosis  I have saw her in 2019, shortly after she moved from [place] to [place], she was diagnosed with relapsing remitting multiple sclerosis by neurologist Dr. Sima Matas in [place] 4 months postpartum of her first child  She presented with acute onset of radiating pain to bilateral upper and lower extremity in February 2002, 4 months postpartum, symptoms quickly progressed to difficulty walking, with profound right arm, and right leg weakness, the diagnosis of relapsing remitting multiple sclerosis was based  on abnormal MRI of cervical spine, there was no MRI brain lesions per patient's recall.   She was treated with steroid for 1 week, with significant side effect, fortunately, her motor deficit improved quickly.  She also had spinal tap, evoked potential study, extensive laboratory evaluations, which all support a diagnosis of multiple sclerosis,   She was treated with Avonex from 2002 to October 2005, there was no recurrent symptoms, she stopped the treatment, had a second pregnancy, did very well,   Over the years, she is highly function, work as a Surveyor, quantity, continue have right leg burning sensation from knee down, taking Cymbalta 60 mg every morning, but denies gait abnormality, no recurrent neurological symptoms, has bladder urgency, denies significant gait abnormality..   She also has history of chronic migraine headache for many years,   Zomig works very well.  Trigger for her migraine are weather change, stress, hungry, dehydration.   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  PHYSICAL EXAMNIATION:  Gen: NAD, conversant, well nourised, well groomed                     Cardiovascular: Regular rate rhythm, no peripheral edema, warm, nontender. Eyes: Conjunctivae clear without exudates or hemorrhage Neck: Supple,  no carotid bruits. Pulmonary: Clear to auscultation bilaterally   NEUROLOGICAL EXAM:  MENTAL STATUS: Speech/cognition: Awake, alert, oriented to history taking and casual conversation CRANIAL NERVES: CN II: Visual fields are full to confrontation. Pupils are round equal and briskly reactive to light. CN III, IV, VI: extraocular movement are normal. No ptosis. CN V: Facial sensation is intact to light touch CN VII: Face is symmetric with normal eye closure  CN VIII: Hearing is normal to causal conversation. CN IX, X: Phonation is normal. CN XI: Head turning and shoulder shrug are intact  MOTOR: There is no pronator drift of out-stretched arms. Muscle bulk and tone are normal. Muscle strength is normal.  REFLEXES: Reflexes are 2+ and symmetric at the biceps, triceps, knees, and ankles. Plantar responses are flexor.  SENSORY: Intact to light touch, pinprick and vibratory sensation are intact in fingers and toes.  COORDINATION: There is no trunk or limb dysmetria noted.  GAIT/STANCE: Posture is normal. Gait is steady with normal steps, base, arm swing, and turning. Heel and toe walking are normal. Tandem gait is normal.  Romberg is absent.  REVIEW OF SYSTEMS:  Full 14 system review of systems performed and notable only for as above All other review of systems were negative.   ALLERGIES: No Known Allergies  HOME MEDICATIONS: Current Outpatient Medications  Medication Sig Dispense Refill   amphetamine-dextroamphetamine (ADDERALL XR) 20 MG 24 hr capsule Take 20 mg by mouth every morning.     amphetamine-dextroamphetamine (ADDERALL) 30 MG tablet Take 30 mg by mouth daily.     DULoxetine (CYMBALTA) 60 MG capsule Take 60 mg by mouth daily.     traZODone (DESYREL) 50 MG tablet Take 100 mg by mouth at bedtime as needed.     zolmitriptan (ZOMIG) 5 MG tablet Take 5 mg by mouth as needed for migraine.     zolmitriptan (ZOMIG-ZMT) 5 MG disintegrating tablet Take by mouth daily as  needed.     No current facility-administered medications for this visit.    PAST MEDICAL HISTORY: Past Medical History:  Diagnosis Date   ADD (attention deficit disorder)    Anxiety    on meds   Migraines    PRN meds   Multiple sclerosis (HCC)    Neuromuscular disorder (HCC)    Situational depression    on meds    PAST SURGICAL HISTORY: Past Surgical History:  Procedure Laterality Date   DILATION AND CURETTAGE OF UTERUS  2008   PILONIDAL CYST EXCISION  1998   TONSILLECTOMY AND ADENOIDECTOMY     WISDOM TOOTH EXTRACTION      FAMILY HISTORY: Family History  Problem Relation Age of Onset   Hypertension Mother    Diabetes Father    Heart disease Father    Sleep apnea Father    High Cholesterol Father    High Cholesterol Brother    Colon polyps Paternal Uncle 53   Colon cancer Paternal Uncle 73   High Cholesterol Maternal Grandfather    Hypertension Maternal Grandfather    Diabetes Paternal Grandmother    Heart disease Paternal Grandfather    High Cholesterol Paternal Grandfather    Diabetes Paternal Grandfather  Esophageal cancer Neg Hx    Rectal cancer Neg Hx    Stomach cancer Neg Hx     SOCIAL HISTORY: Social History   Socioeconomic History   Marital status: Married    Spouse name: Not on file   Number of children: Not on file   Years of education: Not on file   Highest education level: Not on file  Occupational History   Not on file  Tobacco Use   Smoking status: Former   Smokeless tobacco: Never  Vaping Use   Vaping status: Never Used  Substance and Sexual Activity   Alcohol use: Yes    Alcohol/week: 2.0 standard drinks of alcohol    Types: 2 Standard drinks or equivalent per week    Comment: occ   Drug use: No   Sexual activity: Not on file  Other Topics Concern   Not on file  Social History Narrative   Not on file   Social Determinants of Health   Financial Resource Strain: Not on file  Food Insecurity: Not on file  Transportation  Needs: Not on file  Physical Activity: Not on file  Stress: Not on file  Social Connections: Not on file  Intimate Partner Violence: Not on file      Levert Feinstein, M.D. Ph.D.  [facility] [address] [place], [place] [address] Ph: [phone] Fax: [phone]  CC:  Garlan Fillers, MD [address] [place],  [place] [address]  Garlan Fillers, MD

## 2023-09-23 DIAGNOSIS — G35 Multiple sclerosis: Secondary | ICD-10-CM | POA: Diagnosis not present

## 2024-01-28 DIAGNOSIS — F902 Attention-deficit hyperactivity disorder, combined type: Secondary | ICD-10-CM | POA: Diagnosis not present

## 2024-01-28 DIAGNOSIS — Z79899 Other long term (current) drug therapy: Secondary | ICD-10-CM | POA: Diagnosis not present

## 2024-03-24 DIAGNOSIS — E785 Hyperlipidemia, unspecified: Secondary | ICD-10-CM | POA: Diagnosis not present

## 2024-03-27 DIAGNOSIS — Z0000 Encounter for general adult medical examination without abnormal findings: Secondary | ICD-10-CM | POA: Diagnosis not present

## 2024-03-27 DIAGNOSIS — G35 Multiple sclerosis: Secondary | ICD-10-CM | POA: Diagnosis not present

## 2024-03-27 DIAGNOSIS — R82998 Other abnormal findings in urine: Secondary | ICD-10-CM | POA: Diagnosis not present

## 2024-03-28 DIAGNOSIS — Z713 Dietary counseling and surveillance: Secondary | ICD-10-CM | POA: Diagnosis not present

## 2024-03-28 DIAGNOSIS — Z723 Lack of physical exercise: Secondary | ICD-10-CM | POA: Diagnosis not present

## 2024-03-28 DIAGNOSIS — Z724 Inappropriate diet and eating habits: Secondary | ICD-10-CM | POA: Diagnosis not present

## 2024-04-08 DIAGNOSIS — G43009 Migraine without aura, not intractable, without status migrainosus: Secondary | ICD-10-CM | POA: Diagnosis not present

## 2024-04-08 DIAGNOSIS — R5383 Other fatigue: Secondary | ICD-10-CM | POA: Diagnosis not present

## 2024-04-15 DIAGNOSIS — R682 Dry mouth, unspecified: Secondary | ICD-10-CM | POA: Diagnosis not present

## 2024-04-15 DIAGNOSIS — E785 Hyperlipidemia, unspecified: Secondary | ICD-10-CM | POA: Diagnosis not present

## 2024-04-20 DIAGNOSIS — R5383 Other fatigue: Secondary | ICD-10-CM | POA: Diagnosis not present

## 2024-04-20 DIAGNOSIS — G43009 Migraine without aura, not intractable, without status migrainosus: Secondary | ICD-10-CM | POA: Diagnosis not present

## 2024-04-20 DIAGNOSIS — E785 Hyperlipidemia, unspecified: Secondary | ICD-10-CM | POA: Diagnosis not present

## 2024-04-27 DIAGNOSIS — G43009 Migraine without aura, not intractable, without status migrainosus: Secondary | ICD-10-CM | POA: Diagnosis not present

## 2024-04-27 DIAGNOSIS — E785 Hyperlipidemia, unspecified: Secondary | ICD-10-CM | POA: Diagnosis not present

## 2024-04-27 DIAGNOSIS — R5383 Other fatigue: Secondary | ICD-10-CM | POA: Diagnosis not present

## 2024-05-04 DIAGNOSIS — G43009 Migraine without aura, not intractable, without status migrainosus: Secondary | ICD-10-CM | POA: Diagnosis not present

## 2024-05-04 DIAGNOSIS — R5383 Other fatigue: Secondary | ICD-10-CM | POA: Diagnosis not present

## 2024-05-16 DIAGNOSIS — G43009 Migraine without aura, not intractable, without status migrainosus: Secondary | ICD-10-CM | POA: Diagnosis not present

## 2024-05-16 DIAGNOSIS — R5383 Other fatigue: Secondary | ICD-10-CM | POA: Diagnosis not present

## 2024-05-23 DIAGNOSIS — E785 Hyperlipidemia, unspecified: Secondary | ICD-10-CM | POA: Diagnosis not present

## 2024-05-23 DIAGNOSIS — R609 Edema, unspecified: Secondary | ICD-10-CM | POA: Diagnosis not present

## 2024-05-23 DIAGNOSIS — G43009 Migraine without aura, not intractable, without status migrainosus: Secondary | ICD-10-CM | POA: Diagnosis not present

## 2024-06-08 DIAGNOSIS — G43009 Migraine without aura, not intractable, without status migrainosus: Secondary | ICD-10-CM | POA: Diagnosis not present

## 2024-06-08 DIAGNOSIS — G4700 Insomnia, unspecified: Secondary | ICD-10-CM | POA: Diagnosis not present

## 2024-06-08 DIAGNOSIS — E785 Hyperlipidemia, unspecified: Secondary | ICD-10-CM | POA: Diagnosis not present

## 2024-06-15 DIAGNOSIS — R5383 Other fatigue: Secondary | ICD-10-CM | POA: Diagnosis not present

## 2024-06-15 DIAGNOSIS — E785 Hyperlipidemia, unspecified: Secondary | ICD-10-CM | POA: Diagnosis not present

## 2024-06-22 DIAGNOSIS — G43009 Migraine without aura, not intractable, without status migrainosus: Secondary | ICD-10-CM | POA: Diagnosis not present

## 2024-06-22 DIAGNOSIS — K5900 Constipation, unspecified: Secondary | ICD-10-CM | POA: Diagnosis not present

## 2024-06-22 DIAGNOSIS — I10 Essential (primary) hypertension: Secondary | ICD-10-CM | POA: Diagnosis not present

## 2024-07-01 DIAGNOSIS — I10 Essential (primary) hypertension: Secondary | ICD-10-CM | POA: Diagnosis not present

## 2024-07-01 DIAGNOSIS — R5383 Other fatigue: Secondary | ICD-10-CM | POA: Diagnosis not present

## 2024-07-06 DIAGNOSIS — G43009 Migraine without aura, not intractable, without status migrainosus: Secondary | ICD-10-CM | POA: Diagnosis not present

## 2024-07-06 DIAGNOSIS — I10 Essential (primary) hypertension: Secondary | ICD-10-CM | POA: Diagnosis not present

## 2024-07-06 DIAGNOSIS — R609 Edema, unspecified: Secondary | ICD-10-CM | POA: Diagnosis not present

## 2024-07-18 DIAGNOSIS — G43009 Migraine without aura, not intractable, without status migrainosus: Secondary | ICD-10-CM | POA: Diagnosis not present

## 2024-07-18 DIAGNOSIS — L659 Nonscarring hair loss, unspecified: Secondary | ICD-10-CM | POA: Diagnosis not present

## 2024-07-18 DIAGNOSIS — E785 Hyperlipidemia, unspecified: Secondary | ICD-10-CM | POA: Diagnosis not present

## 2024-07-18 DIAGNOSIS — I10 Essential (primary) hypertension: Secondary | ICD-10-CM | POA: Diagnosis not present

## 2024-07-25 DIAGNOSIS — I10 Essential (primary) hypertension: Secondary | ICD-10-CM | POA: Diagnosis not present

## 2024-07-25 DIAGNOSIS — G43009 Migraine without aura, not intractable, without status migrainosus: Secondary | ICD-10-CM | POA: Diagnosis not present

## 2024-07-25 DIAGNOSIS — E785 Hyperlipidemia, unspecified: Secondary | ICD-10-CM | POA: Diagnosis not present

## 2024-07-25 DIAGNOSIS — R5383 Other fatigue: Secondary | ICD-10-CM | POA: Diagnosis not present

## 2024-08-01 DIAGNOSIS — G43009 Migraine without aura, not intractable, without status migrainosus: Secondary | ICD-10-CM | POA: Diagnosis not present

## 2024-08-01 DIAGNOSIS — R609 Edema, unspecified: Secondary | ICD-10-CM | POA: Diagnosis not present

## 2024-08-01 DIAGNOSIS — E785 Hyperlipidemia, unspecified: Secondary | ICD-10-CM | POA: Diagnosis not present

## 2024-08-01 DIAGNOSIS — I10 Essential (primary) hypertension: Secondary | ICD-10-CM | POA: Diagnosis not present

## 2024-08-08 DIAGNOSIS — E785 Hyperlipidemia, unspecified: Secondary | ICD-10-CM | POA: Diagnosis not present

## 2024-08-08 DIAGNOSIS — R5383 Other fatigue: Secondary | ICD-10-CM | POA: Diagnosis not present

## 2024-08-08 DIAGNOSIS — I10 Essential (primary) hypertension: Secondary | ICD-10-CM | POA: Diagnosis not present

## 2024-08-08 DIAGNOSIS — G43009 Migraine without aura, not intractable, without status migrainosus: Secondary | ICD-10-CM | POA: Diagnosis not present

## 2024-08-15 DIAGNOSIS — I10 Essential (primary) hypertension: Secondary | ICD-10-CM | POA: Diagnosis not present

## 2024-08-15 DIAGNOSIS — G43009 Migraine without aura, not intractable, without status migrainosus: Secondary | ICD-10-CM | POA: Diagnosis not present

## 2024-08-15 DIAGNOSIS — R5383 Other fatigue: Secondary | ICD-10-CM | POA: Diagnosis not present

## 2024-08-15 DIAGNOSIS — E785 Hyperlipidemia, unspecified: Secondary | ICD-10-CM | POA: Diagnosis not present

## 2024-08-22 DIAGNOSIS — I10 Essential (primary) hypertension: Secondary | ICD-10-CM | POA: Diagnosis not present

## 2024-08-22 DIAGNOSIS — G43009 Migraine without aura, not intractable, without status migrainosus: Secondary | ICD-10-CM | POA: Diagnosis not present

## 2024-08-22 DIAGNOSIS — L659 Nonscarring hair loss, unspecified: Secondary | ICD-10-CM | POA: Diagnosis not present

## 2024-08-22 DIAGNOSIS — E785 Hyperlipidemia, unspecified: Secondary | ICD-10-CM | POA: Diagnosis not present

## 2024-09-05 DIAGNOSIS — Z23 Encounter for immunization: Secondary | ICD-10-CM | POA: Diagnosis not present

## 2024-09-05 DIAGNOSIS — G35D Multiple sclerosis, unspecified: Secondary | ICD-10-CM | POA: Diagnosis not present

## 2024-09-09 DIAGNOSIS — R5383 Other fatigue: Secondary | ICD-10-CM | POA: Diagnosis not present

## 2024-09-09 DIAGNOSIS — G43009 Migraine without aura, not intractable, without status migrainosus: Secondary | ICD-10-CM | POA: Diagnosis not present

## 2024-09-09 DIAGNOSIS — E785 Hyperlipidemia, unspecified: Secondary | ICD-10-CM | POA: Diagnosis not present

## 2024-09-18 DIAGNOSIS — F902 Attention-deficit hyperactivity disorder, combined type: Secondary | ICD-10-CM | POA: Diagnosis not present

## 2024-09-18 DIAGNOSIS — Z79899 Other long term (current) drug therapy: Secondary | ICD-10-CM | POA: Diagnosis not present

## 2024-09-26 DIAGNOSIS — R609 Edema, unspecified: Secondary | ICD-10-CM | POA: Diagnosis not present

## 2024-09-26 DIAGNOSIS — G43009 Migraine without aura, not intractable, without status migrainosus: Secondary | ICD-10-CM | POA: Diagnosis not present

## 2024-09-26 DIAGNOSIS — E785 Hyperlipidemia, unspecified: Secondary | ICD-10-CM | POA: Diagnosis not present

## 2024-09-26 DIAGNOSIS — I10 Essential (primary) hypertension: Secondary | ICD-10-CM | POA: Diagnosis not present

## 2024-10-03 DIAGNOSIS — E785 Hyperlipidemia, unspecified: Secondary | ICD-10-CM | POA: Diagnosis not present

## 2024-10-03 DIAGNOSIS — R5383 Other fatigue: Secondary | ICD-10-CM | POA: Diagnosis not present

## 2024-10-03 DIAGNOSIS — G43009 Migraine without aura, not intractable, without status migrainosus: Secondary | ICD-10-CM | POA: Diagnosis not present

## 2024-10-10 DIAGNOSIS — G43009 Migraine without aura, not intractable, without status migrainosus: Secondary | ICD-10-CM | POA: Diagnosis not present

## 2024-10-10 DIAGNOSIS — E785 Hyperlipidemia, unspecified: Secondary | ICD-10-CM | POA: Diagnosis not present

## 2024-10-10 DIAGNOSIS — I10 Essential (primary) hypertension: Secondary | ICD-10-CM | POA: Diagnosis not present

## 2024-10-10 DIAGNOSIS — R609 Edema, unspecified: Secondary | ICD-10-CM | POA: Diagnosis not present

## 2024-10-17 DIAGNOSIS — G43009 Migraine without aura, not intractable, without status migrainosus: Secondary | ICD-10-CM | POA: Diagnosis not present

## 2024-10-17 DIAGNOSIS — R5383 Other fatigue: Secondary | ICD-10-CM | POA: Diagnosis not present

## 2024-10-17 DIAGNOSIS — E785 Hyperlipidemia, unspecified: Secondary | ICD-10-CM | POA: Diagnosis not present

## 2024-10-24 DIAGNOSIS — I10 Essential (primary) hypertension: Secondary | ICD-10-CM | POA: Diagnosis not present

## 2024-10-24 DIAGNOSIS — E785 Hyperlipidemia, unspecified: Secondary | ICD-10-CM | POA: Diagnosis not present

## 2024-10-24 DIAGNOSIS — G43009 Migraine without aura, not intractable, without status migrainosus: Secondary | ICD-10-CM | POA: Diagnosis not present

## 2024-10-24 DIAGNOSIS — R609 Edema, unspecified: Secondary | ICD-10-CM | POA: Diagnosis not present

## 2024-11-02 DIAGNOSIS — E785 Hyperlipidemia, unspecified: Secondary | ICD-10-CM | POA: Diagnosis not present

## 2024-11-02 DIAGNOSIS — R5383 Other fatigue: Secondary | ICD-10-CM | POA: Diagnosis not present

## 2024-11-02 DIAGNOSIS — G43009 Migraine without aura, not intractable, without status migrainosus: Secondary | ICD-10-CM | POA: Diagnosis not present

## 2024-11-11 DIAGNOSIS — E785 Hyperlipidemia, unspecified: Secondary | ICD-10-CM | POA: Diagnosis not present

## 2024-11-11 DIAGNOSIS — I10 Essential (primary) hypertension: Secondary | ICD-10-CM | POA: Diagnosis not present

## 2024-11-11 DIAGNOSIS — G43009 Migraine without aura, not intractable, without status migrainosus: Secondary | ICD-10-CM | POA: Diagnosis not present

## 2024-11-11 DIAGNOSIS — R609 Edema, unspecified: Secondary | ICD-10-CM | POA: Diagnosis not present

## 2024-12-06 ENCOUNTER — Telehealth: Payer: Self-pay | Admitting: Neurology

## 2024-12-06 NOTE — Telephone Encounter (Signed)
 Patient cancelled appointment have lost job.

## 2024-12-07 ENCOUNTER — Institutional Professional Consult (permissible substitution): Admitting: Neurology
